# Patient Record
Sex: Female | Born: 1950 | Race: Black or African American | Hispanic: No | Smoking: Never smoker
Health system: Southern US, Community
[De-identification: ages and names within clinical notes are randomized; demographics above are authoritative.]

## PROBLEM LIST (undated history)

## (undated) DIAGNOSIS — I10 Essential (primary) hypertension: Secondary | ICD-10-CM

## (undated) DIAGNOSIS — E119 Type 2 diabetes mellitus without complications: Secondary | ICD-10-CM

## (undated) DIAGNOSIS — D649 Anemia, unspecified: Secondary | ICD-10-CM

## (undated) DIAGNOSIS — G4730 Sleep apnea, unspecified: Secondary | ICD-10-CM

## (undated) DIAGNOSIS — K219 Gastro-esophageal reflux disease without esophagitis: Secondary | ICD-10-CM

## (undated) DIAGNOSIS — M1990 Unspecified osteoarthritis, unspecified site: Secondary | ICD-10-CM

## (undated) DIAGNOSIS — E7800 Pure hypercholesterolemia, unspecified: Secondary | ICD-10-CM

## (undated) HISTORY — PX: CARPAL TUNNEL RELEASE: SHX101

## (undated) HISTORY — PX: ABDOMINAL HYSTERECTOMY: SHX81

## (undated) HISTORY — PX: HERNIA REPAIR: SHX51

## (undated) HISTORY — PX: REDUCTION MAMMAPLASTY: SUR839

## (undated) HISTORY — PX: NASAL SEPTUM SURGERY: SHX37

## (undated) HISTORY — PX: BREAST REDUCTION SURGERY: SHX8

---

## 2011-12-08 DIAGNOSIS — D369 Benign neoplasm, unspecified site: Secondary | ICD-10-CM | POA: Insufficient documentation

## 2016-09-05 ENCOUNTER — Ambulatory Visit (HOSPITAL_COMMUNITY)
Admission: EM | Admit: 2016-09-05 | Discharge: 2016-09-05 | Disposition: A | Discharge status: Home / Self Care | Payer: Medicare (Managed Care) | Attending: Family Medicine | Admitting: Family Medicine

## 2016-09-05 ENCOUNTER — Encounter (HOSPITAL_COMMUNITY): Payer: Self-pay | Admitting: Emergency Medicine

## 2016-09-05 DIAGNOSIS — T783XXA Angioneurotic edema, initial encounter: Secondary | ICD-10-CM | POA: Diagnosis not present

## 2016-09-05 DIAGNOSIS — T7840XA Allergy, unspecified, initial encounter: Secondary | ICD-10-CM | POA: Diagnosis not present

## 2016-09-05 HISTORY — DX: Pure hypercholesterolemia, unspecified: E78.00

## 2016-09-05 HISTORY — DX: Type 2 diabetes mellitus without complications: E11.9

## 2016-09-05 HISTORY — DX: Essential (primary) hypertension: I10

## 2016-09-05 HISTORY — DX: Sleep apnea, unspecified: G47.30

## 2016-09-05 MED ORDER — HYDROCODONE-ACETAMINOPHEN 5-325 MG PO TABS: 1.0000 | ORAL_TABLET | Freq: Four times a day (QID) | ORAL | 0 refills | Status: DC | PRN

## 2016-09-05 MED ORDER — CHLORHEXIDINE GLUCONATE 0.12 % MT SOLN: 15.0000 mL | Freq: Two times a day (BID) | OROMUCOSAL | 0 refills | Status: DC

## 2016-09-05 MED ORDER — METHYLPREDNISOLONE ACETATE 80 MG/ML IJ SUSP
INTRAMUSCULAR | Status: AC
  Filled 2016-09-05: qty 1

## 2016-09-05 MED ORDER — METHYLPREDNISOLONE ACETATE 80 MG/ML IJ SUSP
80.0000 mg | Freq: Once | INTRAMUSCULAR | Status: AC
  Administered 2016-09-05: 80 mg via INTRAMUSCULAR

## 2016-09-05 NOTE — ED Provider Notes (Signed)
[place]    CSN: [id number] Arrival date & time: 09/05/16  1947     History   Chief Complaint Chief Complaint  Patient presents with  . Angioedema    HPI Meredith Cline is a 66 y.o. female.   66 yo woman from [place] who is here caring for grandson x 3 weeks.  About three days ago, she developed tongue soreness, lip soreness, some oral swelling in those areas and some neck fullness.  No difficulty swallowing or rash.  Symptoms are progressively worse.      Past Medical History:  Diagnosis Date  . Diabetes mellitus without complication ([place])   . Hypercholesterolemia   . Hypertension   . Sleep apnea     There are no active problems to display for this patient.   Past Surgical History:  Procedure Laterality Date  . ABDOMINAL HYSTERECTOMY    . BREAST REDUCTION SURGERY    . CARPAL TUNNEL RELEASE    . HERNIA REPAIR      OB History    No data available       Home Medications    Prior to Admission medications   Medication Sig Start Date End Date Taking? Authorizing Provider  aspirin EC 81 MG tablet Take 81 mg by mouth daily.   Yes Historical Provider, MD  celecoxib (CELEBREX) 200 MG capsule Take 200 mg by mouth 2 (two) times daily.   Yes Historical Provider, MD  ibuprofen (ADVIL,MOTRIN) 800 MG tablet Take 800 mg by mouth every 8 (eight) hours as needed.   Yes Historical Provider, MD  insulin glargine (LANTUS) 100 UNIT/ML injection Inject 22 Units into the skin at bedtime.   Yes Historical Provider, MD  Iron-Vit C-Vit B12-Folic Acid (IRON 779 PLUS PO) Take by mouth.   Yes Historical Provider, MD  magnesium 30 MG tablet Take 250 mg by mouth 2 (two) times daily.   Yes Historical Provider, MD  metFORMIN (GLUCOPHAGE) 500 MG tablet Take by mouth 2 (two) times daily with a meal.   Yes Historical Provider, MD  metoprolol (LOPRESSOR) 50 MG tablet Take 50 mg by mouth 2 (two) times daily.   Yes Historical Provider, MD  omega-3 acid ethyl esters (LOVAZA) 1 g capsule  Take by mouth 2 (two) times daily.   Yes Historical Provider, MD  omeprazole (PRILOSEC) 20 MG capsule Take 20 mg by mouth daily.   Yes Historical Provider, MD  potassium chloride (K-DUR) 10 MEQ tablet Take 10 mEq by mouth daily.   Yes Historical Provider, MD  simvastatin (ZOCOR) 40 MG tablet Take 40 mg by mouth daily.   Yes Historical Provider, MD  terazosin (HYTRIN) 5 MG capsule Take 5 mg by mouth at bedtime.   Yes Historical Provider, MD  triamterene-hydrochlorothiazide (MAXZIDE-25) 37.5-25 MG tablet Take 1 tablet by mouth daily.   Yes Historical Provider, MD  chlorhexidine (PERIDEX) 0.12 % solution Use as directed 15 mLs in the mouth or throat 2 (two) times daily. 09/05/16   Robyn Haber, MD  HYDROcodone-acetaminophen (NORCO) 5-325 MG tablet Take 1 tablet by mouth every 6 (six) hours as needed for moderate pain. 09/05/16   Robyn Haber, MD    Family History History reviewed. No pertinent family history.  Social History Social History  Substance Use Topics  . Smoking status: Never Smoker  . Smokeless tobacco: Never Used  . Alcohol use No     Allergies   Clindamycin/lincomycin and Penicillins   Review of Systems Review of Systems  Constitutional: Negative.  HENT: Positive for facial swelling and mouth sores.   Respiratory: Negative.   Cardiovascular: Negative.   Gastrointestinal: Negative.   Genitourinary: Negative.   Musculoskeletal: Negative.   Neurological: Negative.   All other systems reviewed and are negative.    Physical Exam Triage Vital Signs ED Triage Vitals  Enc Vitals Group     BP 09/05/16 2010 (!) 177/77     Pulse Rate 09/05/16 2010 88     Resp 09/05/16 2010 20     Temp 09/05/16 2010 98.8 F (37.1 C)     Temp Source 09/05/16 2010 Oral     SpO2 09/05/16 2010 98 %     Weight --      Height --      Head Circumference --      Peak Flow --      Pain Score 09/05/16 2002 4     Pain Loc --      Pain Edu? --      Excl. in [place]? --    No data  found.   Updated Vital Signs BP (!) 177/77 (BP Location: Right Wrist)   Pulse 88   Temp 98.8 F (37.1 C) (Oral)   Resp 20   SpO2 98%    Physical Exam  Constitutional: She appears well-developed and well-nourished.  HENT:  Right Ear: External ear normal.  Left Ear: External ear normal.  Mild tongue swelling and lower lip swelling.  No airway compromise.  Superficial aphthous sores on undersurface of tongue and inner lower lip.  Eyes: Conjunctivae are normal. Pupils are equal, round, and reactive to light.  Neck: Normal range of motion. Neck supple. No tracheal deviation present.  Cardiovascular: Normal rate, regular rhythm and normal heart sounds.   Pulmonary/Chest: Effort normal and breath sounds normal.  Musculoskeletal: Normal range of motion.  Lymphadenopathy:    She has no cervical adenopathy.  Neurological: She is alert.  Skin: Skin is warm and dry.  Nursing note and vitals reviewed.    UC Treatments / Results  Labs (all labs ordered are listed, but only abnormal results are displayed) Labs Reviewed - No data to display  EKG  EKG Interpretation None       Radiology No results found.  Procedures Procedures (including critical care time)  Medications Ordered in UC Medications  methylPREDNISolone acetate (DEPO-MEDROL) injection 80 mg (not administered)     Initial Impression / Assessment and Plan / UC Course  I have reviewed the triage vital signs and the nursing notes.  Pertinent labs & imaging results that were available during my care of the patient were reviewed by me and considered in my medical decision making (see chart for details).     Final Clinical Impressions(s) / UC Diagnoses   Final diagnoses:  Allergic reaction, initial encounter    New Prescriptions New Prescriptions   CHLORHEXIDINE (PERIDEX) 0.12 % SOLUTION    Use as directed 15 mLs in the mouth or throat 2 (two) times daily.   HYDROCODONE-ACETAMINOPHEN (NORCO) 5-325 MG TABLET     Take 1 tablet by mouth every 6 (six) hours as needed for moderate pain.     Robyn Haber, MD 09/05/16 2103

## 2016-09-05 NOTE — ED Triage Notes (Signed)
The patient presented to the [facility] with a complaint of angioedema x3 days.

## 2018-01-02 DIAGNOSIS — M25551 Pain in right hip: Secondary | ICD-10-CM | POA: Diagnosis not present

## 2018-01-02 DIAGNOSIS — Z79899 Other long term (current) drug therapy: Secondary | ICD-10-CM | POA: Diagnosis not present

## 2018-01-02 DIAGNOSIS — M549 Dorsalgia, unspecified: Secondary | ICD-10-CM | POA: Diagnosis not present

## 2018-01-31 DIAGNOSIS — M549 Dorsalgia, unspecified: Secondary | ICD-10-CM | POA: Diagnosis not present

## 2018-01-31 DIAGNOSIS — Z79899 Other long term (current) drug therapy: Secondary | ICD-10-CM | POA: Diagnosis not present

## 2018-01-31 DIAGNOSIS — R197 Diarrhea, unspecified: Secondary | ICD-10-CM | POA: Diagnosis not present

## 2018-02-07 DIAGNOSIS — E119 Type 2 diabetes mellitus without complications: Secondary | ICD-10-CM | POA: Diagnosis not present

## 2018-02-07 DIAGNOSIS — E7800 Pure hypercholesterolemia, unspecified: Secondary | ICD-10-CM | POA: Diagnosis not present

## 2018-02-07 DIAGNOSIS — I10 Essential (primary) hypertension: Secondary | ICD-10-CM | POA: Diagnosis not present

## 2018-02-07 DIAGNOSIS — Z0000 Encounter for general adult medical examination without abnormal findings: Secondary | ICD-10-CM | POA: Diagnosis not present

## 2018-02-07 DIAGNOSIS — R5383 Other fatigue: Secondary | ICD-10-CM | POA: Diagnosis not present

## 2018-02-07 DIAGNOSIS — E559 Vitamin D deficiency, unspecified: Secondary | ICD-10-CM | POA: Diagnosis not present

## 2018-02-21 DIAGNOSIS — M549 Dorsalgia, unspecified: Secondary | ICD-10-CM | POA: Diagnosis not present

## 2018-02-21 DIAGNOSIS — I10 Essential (primary) hypertension: Secondary | ICD-10-CM | POA: Diagnosis not present

## 2018-02-21 DIAGNOSIS — E7800 Pure hypercholesterolemia, unspecified: Secondary | ICD-10-CM | POA: Diagnosis not present

## 2018-02-21 DIAGNOSIS — E119 Type 2 diabetes mellitus without complications: Secondary | ICD-10-CM | POA: Diagnosis not present

## 2018-04-04 DIAGNOSIS — R197 Diarrhea, unspecified: Secondary | ICD-10-CM | POA: Diagnosis not present

## 2018-04-04 DIAGNOSIS — I10 Essential (primary) hypertension: Secondary | ICD-10-CM | POA: Diagnosis not present

## 2018-04-04 DIAGNOSIS — D649 Anemia, unspecified: Secondary | ICD-10-CM | POA: Diagnosis not present

## 2018-04-04 DIAGNOSIS — E119 Type 2 diabetes mellitus without complications: Secondary | ICD-10-CM | POA: Diagnosis not present

## 2018-04-17 DIAGNOSIS — R159 Full incontinence of feces: Secondary | ICD-10-CM | POA: Diagnosis not present

## 2018-04-17 DIAGNOSIS — R197 Diarrhea, unspecified: Secondary | ICD-10-CM | POA: Diagnosis not present

## 2018-04-24 DIAGNOSIS — R197 Diarrhea, unspecified: Secondary | ICD-10-CM | POA: Diagnosis not present

## 2018-05-16 DIAGNOSIS — Z01818 Encounter for other preprocedural examination: Secondary | ICD-10-CM | POA: Diagnosis not present

## 2018-05-16 DIAGNOSIS — E119 Type 2 diabetes mellitus without complications: Secondary | ICD-10-CM | POA: Diagnosis not present

## 2018-05-16 DIAGNOSIS — R197 Diarrhea, unspecified: Secondary | ICD-10-CM | POA: Diagnosis not present

## 2018-05-17 DIAGNOSIS — K529 Noninfective gastroenteritis and colitis, unspecified: Secondary | ICD-10-CM | POA: Diagnosis not present

## 2018-05-18 DIAGNOSIS — K529 Noninfective gastroenteritis and colitis, unspecified: Secondary | ICD-10-CM | POA: Diagnosis not present

## 2018-06-04 DIAGNOSIS — I10 Essential (primary) hypertension: Secondary | ICD-10-CM | POA: Diagnosis not present

## 2018-06-04 DIAGNOSIS — E119 Type 2 diabetes mellitus without complications: Secondary | ICD-10-CM | POA: Diagnosis not present

## 2018-06-04 DIAGNOSIS — E7800 Pure hypercholesterolemia, unspecified: Secondary | ICD-10-CM | POA: Diagnosis not present

## 2018-06-04 DIAGNOSIS — Z79899 Other long term (current) drug therapy: Secondary | ICD-10-CM | POA: Diagnosis not present

## 2018-06-21 DIAGNOSIS — R197 Diarrhea, unspecified: Secondary | ICD-10-CM | POA: Diagnosis not present

## 2018-06-21 DIAGNOSIS — Z1211 Encounter for screening for malignant neoplasm of colon: Secondary | ICD-10-CM | POA: Diagnosis not present

## 2018-08-02 DIAGNOSIS — M79604 Pain in right leg: Secondary | ICD-10-CM | POA: Diagnosis not present

## 2018-08-02 DIAGNOSIS — H532 Diplopia: Secondary | ICD-10-CM | POA: Diagnosis not present

## 2018-08-02 DIAGNOSIS — R682 Dry mouth, unspecified: Secondary | ICD-10-CM | POA: Diagnosis not present

## 2018-08-02 DIAGNOSIS — E119 Type 2 diabetes mellitus without complications: Secondary | ICD-10-CM | POA: Diagnosis not present

## 2018-08-02 DIAGNOSIS — M79605 Pain in left leg: Secondary | ICD-10-CM | POA: Diagnosis not present

## 2018-08-09 DIAGNOSIS — G629 Polyneuropathy, unspecified: Secondary | ICD-10-CM | POA: Diagnosis not present

## 2018-10-08 DIAGNOSIS — Z760 Encounter for issue of repeat prescription: Secondary | ICD-10-CM | POA: Diagnosis not present

## 2018-10-08 DIAGNOSIS — E7800 Pure hypercholesterolemia, unspecified: Secondary | ICD-10-CM | POA: Diagnosis not present

## 2018-10-08 DIAGNOSIS — I10 Essential (primary) hypertension: Secondary | ICD-10-CM | POA: Diagnosis not present

## 2018-10-08 DIAGNOSIS — E119 Type 2 diabetes mellitus without complications: Secondary | ICD-10-CM | POA: Diagnosis not present

## 2018-10-16 DIAGNOSIS — M129 Arthropathy, unspecified: Secondary | ICD-10-CM | POA: Diagnosis not present

## 2018-10-16 DIAGNOSIS — E119 Type 2 diabetes mellitus without complications: Secondary | ICD-10-CM | POA: Diagnosis not present

## 2018-10-16 DIAGNOSIS — Z1159 Encounter for screening for other viral diseases: Secondary | ICD-10-CM | POA: Diagnosis not present

## 2018-10-16 DIAGNOSIS — Z0000 Encounter for general adult medical examination without abnormal findings: Secondary | ICD-10-CM | POA: Diagnosis not present

## 2018-10-16 DIAGNOSIS — E7800 Pure hypercholesterolemia, unspecified: Secondary | ICD-10-CM | POA: Diagnosis not present

## 2018-10-16 DIAGNOSIS — E559 Vitamin D deficiency, unspecified: Secondary | ICD-10-CM | POA: Diagnosis not present

## 2018-10-16 DIAGNOSIS — D539 Nutritional anemia, unspecified: Secondary | ICD-10-CM | POA: Diagnosis not present

## 2018-10-16 DIAGNOSIS — I10 Essential (primary) hypertension: Secondary | ICD-10-CM | POA: Diagnosis not present

## 2018-10-30 ENCOUNTER — Other Ambulatory Visit: Payer: Self-pay | Admitting: Physician Assistant

## 2018-10-30 DIAGNOSIS — Z780 Asymptomatic menopausal state: Secondary | ICD-10-CM

## 2018-11-09 DIAGNOSIS — D487 Neoplasm of uncertain behavior of other specified sites: Secondary | ICD-10-CM | POA: Diagnosis not present

## 2018-11-14 DIAGNOSIS — L980 Pyogenic granuloma: Secondary | ICD-10-CM | POA: Diagnosis not present

## 2019-01-22 ENCOUNTER — Other Ambulatory Visit: Payer: Self-pay | Admitting: Physician Assistant

## 2019-01-22 DIAGNOSIS — M545 Low back pain: Secondary | ICD-10-CM | POA: Diagnosis not present

## 2019-01-22 DIAGNOSIS — G8929 Other chronic pain: Secondary | ICD-10-CM | POA: Diagnosis not present

## 2019-01-22 DIAGNOSIS — E559 Vitamin D deficiency, unspecified: Secondary | ICD-10-CM | POA: Diagnosis not present

## 2019-01-22 DIAGNOSIS — H524 Presbyopia: Secondary | ICD-10-CM | POA: Diagnosis not present

## 2019-01-22 DIAGNOSIS — I10 Essential (primary) hypertension: Secondary | ICD-10-CM | POA: Diagnosis not present

## 2019-01-22 DIAGNOSIS — D539 Nutritional anemia, unspecified: Secondary | ICD-10-CM | POA: Diagnosis not present

## 2019-01-22 DIAGNOSIS — E7800 Pure hypercholesterolemia, unspecified: Secondary | ICD-10-CM | POA: Diagnosis not present

## 2019-01-22 DIAGNOSIS — Z1159 Encounter for screening for other viral diseases: Secondary | ICD-10-CM | POA: Diagnosis not present

## 2019-01-22 DIAGNOSIS — E119 Type 2 diabetes mellitus without complications: Secondary | ICD-10-CM | POA: Diagnosis not present

## 2019-01-22 DIAGNOSIS — Z961 Presence of intraocular lens: Secondary | ICD-10-CM | POA: Diagnosis not present

## 2019-01-22 DIAGNOSIS — Z1231 Encounter for screening mammogram for malignant neoplasm of breast: Secondary | ICD-10-CM

## 2019-01-22 DIAGNOSIS — H16403 Unspecified corneal neovascularization, bilateral: Secondary | ICD-10-CM | POA: Diagnosis not present

## 2019-01-22 DIAGNOSIS — M8580 Other specified disorders of bone density and structure, unspecified site: Secondary | ICD-10-CM

## 2019-01-23 ENCOUNTER — Other Ambulatory Visit: Payer: Self-pay | Admitting: Physician Assistant

## 2019-01-23 DIAGNOSIS — Z1231 Encounter for screening mammogram for malignant neoplasm of breast: Secondary | ICD-10-CM

## 2019-01-28 DIAGNOSIS — M1990 Unspecified osteoarthritis, unspecified site: Secondary | ICD-10-CM | POA: Diagnosis not present

## 2019-01-28 DIAGNOSIS — R5383 Other fatigue: Secondary | ICD-10-CM | POA: Diagnosis not present

## 2019-01-28 DIAGNOSIS — M19071 Primary osteoarthritis, right ankle and foot: Secondary | ICD-10-CM | POA: Diagnosis not present

## 2019-01-28 DIAGNOSIS — R682 Dry mouth, unspecified: Secondary | ICD-10-CM | POA: Diagnosis not present

## 2019-01-28 DIAGNOSIS — M1811 Unilateral primary osteoarthritis of first carpometacarpal joint, right hand: Secondary | ICD-10-CM | POA: Diagnosis not present

## 2019-01-28 DIAGNOSIS — M1711 Unilateral primary osteoarthritis, right knee: Secondary | ICD-10-CM | POA: Diagnosis not present

## 2019-01-28 DIAGNOSIS — M25562 Pain in left knee: Secondary | ICD-10-CM | POA: Diagnosis not present

## 2019-01-28 DIAGNOSIS — M19072 Primary osteoarthritis, left ankle and foot: Secondary | ICD-10-CM | POA: Diagnosis not present

## 2019-01-28 DIAGNOSIS — M3500 Sicca syndrome, unspecified: Secondary | ICD-10-CM | POA: Diagnosis not present

## 2019-01-28 DIAGNOSIS — H04129 Dry eye syndrome of unspecified lacrimal gland: Secondary | ICD-10-CM | POA: Diagnosis not present

## 2019-01-28 DIAGNOSIS — M1712 Unilateral primary osteoarthritis, left knee: Secondary | ICD-10-CM | POA: Diagnosis not present

## 2019-01-28 DIAGNOSIS — M79642 Pain in left hand: Secondary | ICD-10-CM | POA: Diagnosis not present

## 2019-01-28 DIAGNOSIS — M25473 Effusion, unspecified ankle: Secondary | ICD-10-CM | POA: Diagnosis not present

## 2019-01-28 DIAGNOSIS — M545 Low back pain: Secondary | ICD-10-CM | POA: Diagnosis not present

## 2019-01-28 DIAGNOSIS — M79643 Pain in unspecified hand: Secondary | ICD-10-CM | POA: Diagnosis not present

## 2019-01-28 DIAGNOSIS — M2550 Pain in unspecified joint: Secondary | ICD-10-CM | POA: Diagnosis not present

## 2019-01-28 DIAGNOSIS — M79672 Pain in left foot: Secondary | ICD-10-CM | POA: Diagnosis not present

## 2019-01-28 DIAGNOSIS — E1169 Type 2 diabetes mellitus with other specified complication: Secondary | ICD-10-CM | POA: Diagnosis not present

## 2019-01-28 DIAGNOSIS — M1812 Unilateral primary osteoarthritis of first carpometacarpal joint, left hand: Secondary | ICD-10-CM | POA: Diagnosis not present

## 2019-01-28 DIAGNOSIS — M549 Dorsalgia, unspecified: Secondary | ICD-10-CM | POA: Diagnosis not present

## 2019-01-28 DIAGNOSIS — M25561 Pain in right knee: Secondary | ICD-10-CM | POA: Diagnosis not present

## 2019-01-28 DIAGNOSIS — M791 Myalgia, unspecified site: Secondary | ICD-10-CM | POA: Diagnosis not present

## 2019-01-28 DIAGNOSIS — M79641 Pain in right hand: Secondary | ICD-10-CM | POA: Diagnosis not present

## 2019-01-28 DIAGNOSIS — M79671 Pain in right foot: Secondary | ICD-10-CM | POA: Diagnosis not present

## 2019-02-19 DIAGNOSIS — M791 Myalgia, unspecified site: Secondary | ICD-10-CM | POA: Diagnosis not present

## 2019-02-19 DIAGNOSIS — M1990 Unspecified osteoarthritis, unspecified site: Secondary | ICD-10-CM | POA: Diagnosis not present

## 2019-02-19 DIAGNOSIS — R700 Elevated erythrocyte sedimentation rate: Secondary | ICD-10-CM | POA: Diagnosis not present

## 2019-02-19 DIAGNOSIS — R51 Headache: Secondary | ICD-10-CM | POA: Diagnosis not present

## 2019-02-19 DIAGNOSIS — M2550 Pain in unspecified joint: Secondary | ICD-10-CM | POA: Diagnosis not present

## 2019-02-19 DIAGNOSIS — M549 Dorsalgia, unspecified: Secondary | ICD-10-CM | POA: Diagnosis not present

## 2019-02-19 DIAGNOSIS — E1169 Type 2 diabetes mellitus with other specified complication: Secondary | ICD-10-CM | POA: Diagnosis not present

## 2019-02-19 DIAGNOSIS — R5383 Other fatigue: Secondary | ICD-10-CM | POA: Diagnosis not present

## 2019-02-19 DIAGNOSIS — M79643 Pain in unspecified hand: Secondary | ICD-10-CM | POA: Diagnosis not present

## 2019-02-19 DIAGNOSIS — M3500 Sicca syndrome, unspecified: Secondary | ICD-10-CM | POA: Diagnosis not present

## 2019-02-19 DIAGNOSIS — M0579 Rheumatoid arthritis with rheumatoid factor of multiple sites without organ or systems involvement: Secondary | ICD-10-CM | POA: Diagnosis not present

## 2019-02-25 DIAGNOSIS — H903 Sensorineural hearing loss, bilateral: Secondary | ICD-10-CM | POA: Diagnosis not present

## 2019-02-25 DIAGNOSIS — H9113 Presbycusis, bilateral: Secondary | ICD-10-CM | POA: Insufficient documentation

## 2019-02-25 DIAGNOSIS — H9313 Tinnitus, bilateral: Secondary | ICD-10-CM | POA: Diagnosis not present

## 2019-02-25 DIAGNOSIS — R519 Headache, unspecified: Secondary | ICD-10-CM | POA: Insufficient documentation

## 2019-02-25 DIAGNOSIS — R51 Headache: Secondary | ICD-10-CM | POA: Diagnosis not present

## 2019-02-25 DIAGNOSIS — M3500 Sicca syndrome, unspecified: Secondary | ICD-10-CM | POA: Insufficient documentation

## 2019-03-01 DIAGNOSIS — R519 Headache, unspecified: Secondary | ICD-10-CM | POA: Diagnosis not present

## 2019-03-01 DIAGNOSIS — R700 Elevated erythrocyte sedimentation rate: Secondary | ICD-10-CM | POA: Diagnosis not present

## 2019-03-19 DIAGNOSIS — M791 Myalgia, unspecified site: Secondary | ICD-10-CM | POA: Diagnosis not present

## 2019-03-19 DIAGNOSIS — M549 Dorsalgia, unspecified: Secondary | ICD-10-CM | POA: Diagnosis not present

## 2019-03-19 DIAGNOSIS — E1169 Type 2 diabetes mellitus with other specified complication: Secondary | ICD-10-CM | POA: Diagnosis not present

## 2019-03-19 DIAGNOSIS — M79643 Pain in unspecified hand: Secondary | ICD-10-CM | POA: Diagnosis not present

## 2019-03-19 DIAGNOSIS — M3500 Sicca syndrome, unspecified: Secondary | ICD-10-CM | POA: Diagnosis not present

## 2019-03-19 DIAGNOSIS — R700 Elevated erythrocyte sedimentation rate: Secondary | ICD-10-CM | POA: Diagnosis not present

## 2019-03-19 DIAGNOSIS — R519 Headache, unspecified: Secondary | ICD-10-CM | POA: Diagnosis not present

## 2019-03-19 DIAGNOSIS — R5383 Other fatigue: Secondary | ICD-10-CM | POA: Diagnosis not present

## 2019-03-19 DIAGNOSIS — M1990 Unspecified osteoarthritis, unspecified site: Secondary | ICD-10-CM | POA: Diagnosis not present

## 2019-03-19 DIAGNOSIS — M0579 Rheumatoid arthritis with rheumatoid factor of multiple sites without organ or systems involvement: Secondary | ICD-10-CM | POA: Diagnosis not present

## 2019-03-19 DIAGNOSIS — M2550 Pain in unspecified joint: Secondary | ICD-10-CM | POA: Diagnosis not present

## 2019-04-04 ENCOUNTER — Other Ambulatory Visit: Payer: Self-pay | Admitting: Physician Assistant

## 2019-04-04 DIAGNOSIS — M8580 Other specified disorders of bone density and structure, unspecified site: Secondary | ICD-10-CM

## 2019-04-08 ENCOUNTER — Ambulatory Visit
Admission: RE | Admit: 2019-04-08 | Discharge: 2019-04-08 | Disposition: A | Discharge status: Home / Self Care | Payer: PPO | Source: Ambulatory Visit | Attending: Physician Assistant | Admitting: Physician Assistant

## 2019-04-08 ENCOUNTER — Other Ambulatory Visit: Payer: Self-pay

## 2019-04-08 DIAGNOSIS — M8589 Other specified disorders of bone density and structure, multiple sites: Secondary | ICD-10-CM | POA: Diagnosis not present

## 2019-04-08 DIAGNOSIS — M8580 Other specified disorders of bone density and structure, unspecified site: Secondary | ICD-10-CM

## 2019-04-08 DIAGNOSIS — Z1231 Encounter for screening mammogram for malignant neoplasm of breast: Secondary | ICD-10-CM

## 2019-04-08 DIAGNOSIS — Z780 Asymptomatic menopausal state: Secondary | ICD-10-CM | POA: Diagnosis not present

## 2019-04-17 DIAGNOSIS — M3500 Sicca syndrome, unspecified: Secondary | ICD-10-CM | POA: Diagnosis not present

## 2019-04-17 DIAGNOSIS — E1169 Type 2 diabetes mellitus with other specified complication: Secondary | ICD-10-CM | POA: Diagnosis not present

## 2019-04-17 DIAGNOSIS — R700 Elevated erythrocyte sedimentation rate: Secondary | ICD-10-CM | POA: Diagnosis not present

## 2019-04-17 DIAGNOSIS — R5383 Other fatigue: Secondary | ICD-10-CM | POA: Diagnosis not present

## 2019-04-17 DIAGNOSIS — M1990 Unspecified osteoarthritis, unspecified site: Secondary | ICD-10-CM | POA: Diagnosis not present

## 2019-04-17 DIAGNOSIS — M549 Dorsalgia, unspecified: Secondary | ICD-10-CM | POA: Diagnosis not present

## 2019-04-17 DIAGNOSIS — M0579 Rheumatoid arthritis with rheumatoid factor of multiple sites without organ or systems involvement: Secondary | ICD-10-CM | POA: Diagnosis not present

## 2019-04-17 DIAGNOSIS — M79643 Pain in unspecified hand: Secondary | ICD-10-CM | POA: Diagnosis not present

## 2019-04-17 DIAGNOSIS — M791 Myalgia, unspecified site: Secondary | ICD-10-CM | POA: Diagnosis not present

## 2019-04-17 DIAGNOSIS — M2550 Pain in unspecified joint: Secondary | ICD-10-CM | POA: Diagnosis not present

## 2019-04-24 DIAGNOSIS — E119 Type 2 diabetes mellitus without complications: Secondary | ICD-10-CM | POA: Diagnosis not present

## 2019-04-24 DIAGNOSIS — E559 Vitamin D deficiency, unspecified: Secondary | ICD-10-CM | POA: Diagnosis not present

## 2019-04-24 DIAGNOSIS — E7800 Pure hypercholesterolemia, unspecified: Secondary | ICD-10-CM | POA: Diagnosis not present

## 2019-04-24 DIAGNOSIS — I10 Essential (primary) hypertension: Secondary | ICD-10-CM | POA: Diagnosis not present

## 2019-04-24 DIAGNOSIS — D539 Nutritional anemia, unspecified: Secondary | ICD-10-CM | POA: Diagnosis not present

## 2019-04-24 DIAGNOSIS — Z1159 Encounter for screening for other viral diseases: Secondary | ICD-10-CM | POA: Diagnosis not present

## 2019-05-22 DIAGNOSIS — M62830 Muscle spasm of back: Secondary | ICD-10-CM | POA: Diagnosis not present

## 2019-05-22 DIAGNOSIS — E7800 Pure hypercholesterolemia, unspecified: Secondary | ICD-10-CM | POA: Diagnosis not present

## 2019-05-22 DIAGNOSIS — I10 Essential (primary) hypertension: Secondary | ICD-10-CM | POA: Diagnosis not present

## 2019-05-22 DIAGNOSIS — E119 Type 2 diabetes mellitus without complications: Secondary | ICD-10-CM | POA: Diagnosis not present

## 2019-06-17 DIAGNOSIS — M2550 Pain in unspecified joint: Secondary | ICD-10-CM | POA: Diagnosis not present

## 2019-06-17 DIAGNOSIS — M0579 Rheumatoid arthritis with rheumatoid factor of multiple sites without organ or systems involvement: Secondary | ICD-10-CM | POA: Diagnosis not present

## 2019-06-17 DIAGNOSIS — M797 Fibromyalgia: Secondary | ICD-10-CM | POA: Diagnosis not present

## 2019-06-17 DIAGNOSIS — E1169 Type 2 diabetes mellitus with other specified complication: Secondary | ICD-10-CM | POA: Diagnosis not present

## 2019-06-17 DIAGNOSIS — M79643 Pain in unspecified hand: Secondary | ICD-10-CM | POA: Diagnosis not present

## 2019-06-17 DIAGNOSIS — M3500 Sicca syndrome, unspecified: Secondary | ICD-10-CM | POA: Diagnosis not present

## 2019-06-17 DIAGNOSIS — D509 Iron deficiency anemia, unspecified: Secondary | ICD-10-CM | POA: Diagnosis not present

## 2019-06-17 DIAGNOSIS — M1990 Unspecified osteoarthritis, unspecified site: Secondary | ICD-10-CM | POA: Diagnosis not present

## 2019-06-17 DIAGNOSIS — R700 Elevated erythrocyte sedimentation rate: Secondary | ICD-10-CM | POA: Diagnosis not present

## 2019-06-17 DIAGNOSIS — R5383 Other fatigue: Secondary | ICD-10-CM | POA: Diagnosis not present

## 2019-06-17 DIAGNOSIS — M549 Dorsalgia, unspecified: Secondary | ICD-10-CM | POA: Diagnosis not present

## 2019-06-21 DIAGNOSIS — M3509 Sicca syndrome with other organ involvement: Secondary | ICD-10-CM | POA: Diagnosis not present

## 2019-06-21 DIAGNOSIS — G8929 Other chronic pain: Secondary | ICD-10-CM | POA: Diagnosis not present

## 2019-06-21 DIAGNOSIS — E119 Type 2 diabetes mellitus without complications: Secondary | ICD-10-CM | POA: Diagnosis not present

## 2019-06-21 DIAGNOSIS — M545 Low back pain: Secondary | ICD-10-CM | POA: Diagnosis not present

## 2019-06-21 DIAGNOSIS — Z79899 Other long term (current) drug therapy: Secondary | ICD-10-CM | POA: Diagnosis not present

## 2019-07-22 DIAGNOSIS — Z1159 Encounter for screening for other viral diseases: Secondary | ICD-10-CM | POA: Diagnosis not present

## 2019-07-22 DIAGNOSIS — Z79899 Other long term (current) drug therapy: Secondary | ICD-10-CM | POA: Diagnosis not present

## 2019-07-22 DIAGNOSIS — E119 Type 2 diabetes mellitus without complications: Secondary | ICD-10-CM | POA: Diagnosis not present

## 2019-07-22 DIAGNOSIS — E559 Vitamin D deficiency, unspecified: Secondary | ICD-10-CM | POA: Diagnosis not present

## 2019-07-22 DIAGNOSIS — E7800 Pure hypercholesterolemia, unspecified: Secondary | ICD-10-CM | POA: Diagnosis not present

## 2019-07-22 DIAGNOSIS — D539 Nutritional anemia, unspecified: Secondary | ICD-10-CM | POA: Diagnosis not present

## 2019-07-22 DIAGNOSIS — I10 Essential (primary) hypertension: Secondary | ICD-10-CM | POA: Diagnosis not present

## 2019-08-09 DIAGNOSIS — H9113 Presbycusis, bilateral: Secondary | ICD-10-CM | POA: Diagnosis not present

## 2019-08-09 DIAGNOSIS — J3489 Other specified disorders of nose and nasal sinuses: Secondary | ICD-10-CM | POA: Diagnosis not present

## 2019-08-09 DIAGNOSIS — H9319 Tinnitus, unspecified ear: Secondary | ICD-10-CM | POA: Diagnosis not present

## 2019-08-19 DIAGNOSIS — Z79899 Other long term (current) drug therapy: Secondary | ICD-10-CM | POA: Diagnosis not present

## 2019-08-19 DIAGNOSIS — E119 Type 2 diabetes mellitus without complications: Secondary | ICD-10-CM | POA: Diagnosis not present

## 2019-08-19 DIAGNOSIS — G8929 Other chronic pain: Secondary | ICD-10-CM | POA: Diagnosis not present

## 2019-08-19 DIAGNOSIS — M3509 Sicca syndrome with other organ involvement: Secondary | ICD-10-CM | POA: Diagnosis not present

## 2019-08-19 DIAGNOSIS — M545 Low back pain: Secondary | ICD-10-CM | POA: Diagnosis not present

## 2019-09-04 DIAGNOSIS — M545 Low back pain: Secondary | ICD-10-CM | POA: Diagnosis not present

## 2019-09-04 DIAGNOSIS — M25551 Pain in right hip: Secondary | ICD-10-CM | POA: Diagnosis not present

## 2019-09-13 DIAGNOSIS — M545 Low back pain: Secondary | ICD-10-CM | POA: Diagnosis not present

## 2019-10-16 DIAGNOSIS — M2550 Pain in unspecified joint: Secondary | ICD-10-CM | POA: Diagnosis not present

## 2019-10-16 DIAGNOSIS — M797 Fibromyalgia: Secondary | ICD-10-CM | POA: Diagnosis not present

## 2019-10-16 DIAGNOSIS — E1169 Type 2 diabetes mellitus with other specified complication: Secondary | ICD-10-CM | POA: Diagnosis not present

## 2019-10-16 DIAGNOSIS — R700 Elevated erythrocyte sedimentation rate: Secondary | ICD-10-CM | POA: Diagnosis not present

## 2019-10-16 DIAGNOSIS — M1990 Unspecified osteoarthritis, unspecified site: Secondary | ICD-10-CM | POA: Diagnosis not present

## 2019-10-16 DIAGNOSIS — M0579 Rheumatoid arthritis with rheumatoid factor of multiple sites without organ or systems involvement: Secondary | ICD-10-CM | POA: Diagnosis not present

## 2019-10-16 DIAGNOSIS — D649 Anemia, unspecified: Secondary | ICD-10-CM | POA: Diagnosis not present

## 2019-10-16 DIAGNOSIS — M549 Dorsalgia, unspecified: Secondary | ICD-10-CM | POA: Diagnosis not present

## 2019-10-16 DIAGNOSIS — R5383 Other fatigue: Secondary | ICD-10-CM | POA: Diagnosis not present

## 2019-10-16 DIAGNOSIS — M79643 Pain in unspecified hand: Secondary | ICD-10-CM | POA: Diagnosis not present

## 2019-10-16 DIAGNOSIS — M3500 Sicca syndrome, unspecified: Secondary | ICD-10-CM | POA: Diagnosis not present

## 2019-10-17 DIAGNOSIS — Z1159 Encounter for screening for other viral diseases: Secondary | ICD-10-CM | POA: Diagnosis not present

## 2019-10-17 DIAGNOSIS — E559 Vitamin D deficiency, unspecified: Secondary | ICD-10-CM | POA: Diagnosis not present

## 2019-10-17 DIAGNOSIS — M545 Low back pain: Secondary | ICD-10-CM | POA: Diagnosis not present

## 2019-10-17 DIAGNOSIS — Z1389 Encounter for screening for other disorder: Secondary | ICD-10-CM | POA: Diagnosis not present

## 2019-10-17 DIAGNOSIS — Z9181 History of falling: Secondary | ICD-10-CM | POA: Diagnosis not present

## 2019-10-17 DIAGNOSIS — E119 Type 2 diabetes mellitus without complications: Secondary | ICD-10-CM | POA: Diagnosis not present

## 2019-10-17 DIAGNOSIS — D539 Nutritional anemia, unspecified: Secondary | ICD-10-CM | POA: Diagnosis not present

## 2019-10-17 DIAGNOSIS — E7800 Pure hypercholesterolemia, unspecified: Secondary | ICD-10-CM | POA: Diagnosis not present

## 2019-10-17 DIAGNOSIS — Z0000 Encounter for general adult medical examination without abnormal findings: Secondary | ICD-10-CM | POA: Diagnosis not present

## 2019-10-17 DIAGNOSIS — Z79899 Other long term (current) drug therapy: Secondary | ICD-10-CM | POA: Diagnosis not present

## 2019-10-17 DIAGNOSIS — I10 Essential (primary) hypertension: Secondary | ICD-10-CM | POA: Diagnosis not present

## 2019-10-17 DIAGNOSIS — Z113 Encounter for screening for infections with a predominantly sexual mode of transmission: Secondary | ICD-10-CM | POA: Diagnosis not present

## 2019-10-31 DIAGNOSIS — R202 Paresthesia of skin: Secondary | ICD-10-CM | POA: Diagnosis not present

## 2020-01-09 DIAGNOSIS — I10 Essential (primary) hypertension: Secondary | ICD-10-CM | POA: Diagnosis not present

## 2020-01-09 DIAGNOSIS — E7800 Pure hypercholesterolemia, unspecified: Secondary | ICD-10-CM | POA: Diagnosis not present

## 2020-01-09 DIAGNOSIS — D509 Iron deficiency anemia, unspecified: Secondary | ICD-10-CM | POA: Diagnosis not present

## 2020-01-09 DIAGNOSIS — E119 Type 2 diabetes mellitus without complications: Secondary | ICD-10-CM | POA: Diagnosis not present

## 2020-01-09 DIAGNOSIS — E559 Vitamin D deficiency, unspecified: Secondary | ICD-10-CM | POA: Diagnosis not present

## 2020-01-09 DIAGNOSIS — Z79899 Other long term (current) drug therapy: Secondary | ICD-10-CM | POA: Diagnosis not present

## 2020-01-16 DIAGNOSIS — E1169 Type 2 diabetes mellitus with other specified complication: Secondary | ICD-10-CM | POA: Diagnosis not present

## 2020-01-16 DIAGNOSIS — R5383 Other fatigue: Secondary | ICD-10-CM | POA: Diagnosis not present

## 2020-01-16 DIAGNOSIS — Z79899 Other long term (current) drug therapy: Secondary | ICD-10-CM | POA: Diagnosis not present

## 2020-01-16 DIAGNOSIS — M549 Dorsalgia, unspecified: Secondary | ICD-10-CM | POA: Diagnosis not present

## 2020-01-16 DIAGNOSIS — M3500 Sicca syndrome, unspecified: Secondary | ICD-10-CM | POA: Diagnosis not present

## 2020-01-16 DIAGNOSIS — M79643 Pain in unspecified hand: Secondary | ICD-10-CM | POA: Diagnosis not present

## 2020-01-16 DIAGNOSIS — M797 Fibromyalgia: Secondary | ICD-10-CM | POA: Diagnosis not present

## 2020-01-16 DIAGNOSIS — M2550 Pain in unspecified joint: Secondary | ICD-10-CM | POA: Diagnosis not present

## 2020-01-16 DIAGNOSIS — D649 Anemia, unspecified: Secondary | ICD-10-CM | POA: Diagnosis not present

## 2020-01-16 DIAGNOSIS — R700 Elevated erythrocyte sedimentation rate: Secondary | ICD-10-CM | POA: Diagnosis not present

## 2020-01-16 DIAGNOSIS — M1990 Unspecified osteoarthritis, unspecified site: Secondary | ICD-10-CM | POA: Diagnosis not present

## 2020-01-16 DIAGNOSIS — M0579 Rheumatoid arthritis with rheumatoid factor of multiple sites without organ or systems involvement: Secondary | ICD-10-CM | POA: Diagnosis not present

## 2020-01-17 DIAGNOSIS — E119 Type 2 diabetes mellitus without complications: Secondary | ICD-10-CM | POA: Diagnosis not present

## 2020-01-17 DIAGNOSIS — H524 Presbyopia: Secondary | ICD-10-CM | POA: Diagnosis not present

## 2020-02-13 DIAGNOSIS — R079 Chest pain, unspecified: Secondary | ICD-10-CM | POA: Diagnosis not present

## 2020-02-13 DIAGNOSIS — R0602 Shortness of breath: Secondary | ICD-10-CM | POA: Diagnosis not present

## 2020-02-13 DIAGNOSIS — R42 Dizziness and giddiness: Secondary | ICD-10-CM | POA: Diagnosis not present

## 2020-02-18 DIAGNOSIS — H04122 Dry eye syndrome of left lacrimal gland: Secondary | ICD-10-CM | POA: Diagnosis not present

## 2020-02-18 DIAGNOSIS — H16142 Punctate keratitis, left eye: Secondary | ICD-10-CM | POA: Diagnosis not present

## 2020-02-21 ENCOUNTER — Other Ambulatory Visit: Payer: Self-pay

## 2020-02-21 ENCOUNTER — Emergency Department (HOSPITAL_COMMUNITY): Payer: PPO

## 2020-02-21 ENCOUNTER — Emergency Department (HOSPITAL_COMMUNITY)
Admission: EM | Admit: 2020-02-21 | Discharge: 2020-02-22 | Disposition: A | Discharge status: Home / Self Care | Payer: PPO | Attending: Emergency Medicine | Admitting: Emergency Medicine

## 2020-02-21 ENCOUNTER — Encounter (HOSPITAL_COMMUNITY): Payer: Self-pay | Admitting: Emergency Medicine

## 2020-02-21 DIAGNOSIS — Z20822 Contact with and (suspected) exposure to covid-19: Secondary | ICD-10-CM | POA: Diagnosis not present

## 2020-02-21 DIAGNOSIS — R079 Chest pain, unspecified: Secondary | ICD-10-CM

## 2020-02-21 DIAGNOSIS — I10 Essential (primary) hypertension: Secondary | ICD-10-CM | POA: Insufficient documentation

## 2020-02-21 DIAGNOSIS — E119 Type 2 diabetes mellitus without complications: Secondary | ICD-10-CM | POA: Insufficient documentation

## 2020-02-21 DIAGNOSIS — R0602 Shortness of breath: Secondary | ICD-10-CM | POA: Diagnosis present

## 2020-02-21 DIAGNOSIS — R0789 Other chest pain: Secondary | ICD-10-CM | POA: Diagnosis not present

## 2020-02-21 LAB — BASIC METABOLIC PANEL
Anion gap: 10 (ref 5–15)
BUN: 17 mg/dL (ref 8–23)
CO2: 24 mmol/L (ref 22–32)
Calcium: 9.4 mg/dL (ref 8.9–10.3)
Chloride: 104 mmol/L (ref 98–111)
Creatinine, Ser: 1.08 mg/dL — ABNORMAL HIGH (ref 0.44–1.00)
GFR calc Af Amer: >60 mL/min (ref >60)
GFR calc non Af Amer: 52 mL/min — ABNORMAL LOW (ref >60)
Glucose, Bld: 103 mg/dL — ABNORMAL HIGH (ref 70–99)
Potassium: 3.7 mmol/L (ref 3.5–5.1)
Sodium: 138 mmol/L (ref 135–145)

## 2020-02-21 LAB — RESPIRATORY PANEL BY RT PCR (FLU A&B, COVID)
Influenza A by PCR: NEGATIVE
Influenza B by PCR: NEGATIVE
SARS Coronavirus 2 by RT PCR: NEGATIVE

## 2020-02-21 LAB — CBC
HCT: 33.6 % — ABNORMAL LOW (ref 36.0–46.0)
Hemoglobin: 10.5 g/dL — ABNORMAL LOW (ref 12.0–15.0)
MCH: 26.3 pg (ref 26.0–34.0)
MCHC: 31.3 g/dL (ref 30.0–36.0)
MCV: 84.2 fL (ref 80.0–100.0)
Platelets: 263 10*3/uL (ref 150–400)
RBC: 3.99 MIL/uL (ref 3.87–5.11)
RDW: 15.6 % — ABNORMAL HIGH (ref 11.5–15.5)
WBC: 5.1 10*3/uL (ref 4.0–10.5)
nRBC: 0 % (ref 0.0–0.2)

## 2020-02-21 LAB — BRAIN NATRIURETIC PEPTIDE: B Natriuretic Peptide: 54.2 pg/mL (ref 0.0–100.0)

## 2020-02-21 LAB — TROPONIN I (HIGH SENSITIVITY): Troponin I (High Sensitivity): 24 ng/L — ABNORMAL HIGH (ref <18)

## 2020-02-21 MED ORDER — SODIUM CHLORIDE (PF) 0.9 % IJ SOLN
INTRAMUSCULAR | Status: AC
  Filled 2020-02-21: qty 50

## 2020-02-21 NOTE — ED Provider Notes (Signed)
[facility] Emergency Department Provider Note MRN:  [id number]  Arrival date & time: 02/22/20     Chief Complaint   Shortness of Breath and Chest Pain   History of Present Illness   Meredith Cline is a 69 y.o. year-old female with a history of diabetes, hypertension presenting to the ED with chief complaint of shortness of breath and chest pain.  Patient has been feeling some mild chest pain and lightheadedness for the past week, worse today with fast heartbeat, accompanied with shortness of breath, discomfort worse with deep breathing.  Denies leg pain or swelling.  Pain described as dull, pressure-like, mild to moderate, constant.  Denies abdominal pain, no headache, no vision change.  Review of Systems  A complete 10 system review of systems was obtained and all systems are negative except as noted in the HPI and PMH.   Patient's Health History    Past Medical History:  Diagnosis Date  . Diabetes mellitus without complication ([place])   . Hypercholesterolemia   . Hypertension   . Sleep apnea     Past Surgical History:  Procedure Laterality Date  . ABDOMINAL HYSTERECTOMY    . BREAST REDUCTION SURGERY    . CARPAL TUNNEL RELEASE    . HERNIA REPAIR    . REDUCTION MAMMAPLASTY     app. 1980    History reviewed. No pertinent family history.  Social History   Socioeconomic History  . Marital status: Single    Spouse name: Not on file  . Number of children: Not on file  . Years of education: Not on file  . Highest education level: Not on file  Occupational History  . Not on file  Tobacco Use  . Smoking status: Never Smoker  . Smokeless tobacco: Never Used  Vaping Use  . Vaping Use: Never used  Substance and Sexual Activity  . Alcohol use: No  . Drug use: Never  . Sexual activity: Not on file  Other Topics Concern  . Not on file  Social History Narrative  . Not on file   Social Determinants of Health   Financial Resource Strain:   .  Difficulty of Paying Living Expenses: Not on file  Food Insecurity:   . Worried About Charity fundraiser in the Last Year: Not on file  . Ran Out of Food in the Last Year: Not on file  Transportation Needs:   . Lack of Transportation (Medical): Not on file  . Lack of Transportation (Non-Medical): Not on file  Physical Activity:   . Days of Exercise per Week: Not on file  . Minutes of Exercise per Session: Not on file  Stress:   . Feeling of Stress : Not on file  Social Connections:   . Frequency of Communication with Friends and Family: Not on file  . Frequency of Social Gatherings with Friends and Family: Not on file  . Attends Religious Services: Not on file  . Active Member of Clubs or Organizations: Not on file  . Attends Archivist Meetings: Not on file  . Marital Status: Not on file  Intimate Partner Violence:   . Fear of Current or Ex-Partner: Not on file  . Emotionally Abused: Not on file  . Physically Abused: Not on file  . Sexually Abused: Not on file     Physical Exam   Vitals:   02/22/20 0100 02/22/20 0130  BP: (!) 160/73 (!) 163/84  Pulse: 80 84  Resp: 16 19  Temp:    SpO2: 95% 92%    CONSTITUTIONAL: Well-appearing, NAD NEURO:  Alert and oriented x 3, no focal deficits EYES:  eyes equal and reactive ENT/NECK:  no LAD, no JVD CARDIO: Tachycardic rate, well-perfused, normal S1 and S2 PULM:  CTAB no wheezing or rhonchi GI/GU:  normal bowel sounds, non-distended, non-tender MSK/SPINE:  No gross deformities, no edema SKIN:  no rash, atraumatic PSYCH:  Appropriate speech and behavior  *Additional and/or pertinent findings included in MDM below  Diagnostic and Interventional Summary    EKG Interpretation  Date/Time:  Friday February 21 2020 21:31:17 EDT Ventricular Rate:  133 PR Interval:    QRS Duration: 92 QT Interval:  305 QTC Calculation: 454 R Axis:   -5 Text Interpretation: Sinus tachycardia No previous tracing Confirmed by Lajean Saver [phone]) on 02/21/2020 10:52:37 PM      Labs Reviewed  BASIC METABOLIC PANEL - Abnormal; Notable for the following components:      Result Value   Glucose, Bld 103 (*)    Creatinine, Ser 1.08 (*)    GFR calc non Af Amer 52 (*)    All other components within normal limits  CBC - Abnormal; Notable for the following components:   Hemoglobin 10.5 (*)    HCT 33.6 (*)    RDW 15.6 (*)    All other components within normal limits  TROPONIN I (HIGH SENSITIVITY) - Abnormal; Notable for the following components:   Troponin I (High Sensitivity) 24 (*)    All other components within normal limits  TROPONIN I (HIGH SENSITIVITY) - Abnormal; Notable for the following components:   Troponin I (High Sensitivity) 24 (*)    All other components within normal limits  RESPIRATORY PANEL BY RT PCR (FLU A&B, COVID)  BRAIN NATRIURETIC PEPTIDE    CT ANGIO CHEST PE W OR WO CONTRAST  Final Result    DG Chest 2 View  Final Result      Medications  sodium chloride (PF) 0.9 % injection (  Given by Other 02/22/20 0031)  iohexol (OMNIPAQUE) 350 MG/ML injection 100 mL (100 mLs Intravenous Contrast Given 02/22/20 0016)     Procedures  /  Critical Care Procedures  ED Course and Medical Decision Making  I have reviewed the triage vital signs, the nursing notes, and pertinent available records from the EMR.  Listed above are laboratory and imaging tests that I personally ordered, reviewed, and interpreted and then considered in my medical decision making (see below for details).  Moderate to high suspicion for PE, will need CTA.  Also considering ACS, EKG is without ischemic features.     Work-up reveals a minimally elevated troponin at 24, repeat 2 hours later flat at 24. CTA is without evidence of PE. Upon reassessment patient is overall feeling better, no longer tachycardic. We discussed management options, with option 1 being admission for trending of troponins and possible inpatient stress testing  option to being close follow-up and strict return precautions. Patient prefers discharge and close outpatient follow-up, she already has an echocardiogram scheduled early next week.  [name]. [name], [place] [email or web address]  Final Clinical Impressions(s) / ED Diagnoses     ICD-10-CM   1. Chest pain, unspecified type  R07.9     ED Discharge Orders    None       Discharge Instructions Discussed with and Provided to Patient:     Discharge Instructions     You were evaluated  in the Emergency Department and after careful evaluation, we did not find any emergent condition requiring admission or further testing in the hospital.  Your exam/testing today was overall reassuring. As discussed, we recommend close follow-up with your primary care doctor or cardiologist to discuss your symptoms and possibly consider outpatient stress testing. We advise this follow-up within the next 1 to 2 weeks.  Please return to the Emergency Department if you experience any worsening of your condition.  Thank you for allowing [place] to be a part of your care.        Maudie Flakes, MD 02/22/20 [phone]

## 2020-02-21 NOTE — ED Notes (Signed)
Patient transported to X-ray 

## 2020-02-21 NOTE — ED Triage Notes (Signed)
Patient states she has been having pain since the middle of the week. Patient went to pcp and the ekg was fine. PCP schedule echo for next Tuesday. Patient states she is having left chest pain that is getting worse. Patient is also complaining of sob.

## 2020-02-22 ENCOUNTER — Emergency Department (HOSPITAL_COMMUNITY): Payer: PPO

## 2020-02-22 ENCOUNTER — Encounter (HOSPITAL_COMMUNITY): Payer: Self-pay

## 2020-02-22 DIAGNOSIS — R0789 Other chest pain: Secondary | ICD-10-CM | POA: Diagnosis not present

## 2020-02-22 LAB — TROPONIN I (HIGH SENSITIVITY): Troponin I (High Sensitivity): 24 ng/L — ABNORMAL HIGH (ref <18)

## 2020-02-22 MED ORDER — IOHEXOL 350 MG/ML SOLN
100.0000 mL | Freq: Once | INTRAVENOUS | Status: AC | PRN
  Administered 2020-02-22: 100 mL via INTRAVENOUS

## 2020-02-22 NOTE — ED Notes (Signed)
Patient transported to CT 

## 2020-02-22 NOTE — Discharge Instructions (Addendum)
You were evaluated in the Emergency Department and after careful evaluation, we did not find any emergent condition requiring admission or further testing in the hospital.  Your exam/testing today was overall reassuring. As discussed, we recommend close follow-up with your primary care doctor or cardiologist to discuss your symptoms and possibly consider outpatient stress testing. We advise this follow-up within the next 1 to 2 weeks.  Please return to the Emergency Department if you experience any worsening of your condition.  Thank you for allowing [place] to be a part of your care.

## 2020-02-25 DIAGNOSIS — R0602 Shortness of breath: Secondary | ICD-10-CM | POA: Diagnosis not present

## 2020-03-11 DIAGNOSIS — I10 Essential (primary) hypertension: Secondary | ICD-10-CM | POA: Diagnosis not present

## 2020-03-11 DIAGNOSIS — G63 Polyneuropathy in diseases classified elsewhere: Secondary | ICD-10-CM | POA: Diagnosis not present

## 2020-03-11 DIAGNOSIS — M3509 Sicca syndrome with other organ involvement: Secondary | ICD-10-CM | POA: Diagnosis not present

## 2020-03-11 DIAGNOSIS — E119 Type 2 diabetes mellitus without complications: Secondary | ICD-10-CM | POA: Diagnosis not present

## 2020-03-19 DIAGNOSIS — H04222 Epiphora due to insufficient drainage, left lacrimal gland: Secondary | ICD-10-CM | POA: Diagnosis not present

## 2020-03-19 DIAGNOSIS — H16142 Punctate keratitis, left eye: Secondary | ICD-10-CM | POA: Diagnosis not present

## 2020-03-19 DIAGNOSIS — H04122 Dry eye syndrome of left lacrimal gland: Secondary | ICD-10-CM | POA: Diagnosis not present

## 2020-03-23 ENCOUNTER — Other Ambulatory Visit: Payer: Self-pay | Admitting: Family Medicine

## 2020-03-23 DIAGNOSIS — Z1231 Encounter for screening mammogram for malignant neoplasm of breast: Secondary | ICD-10-CM

## 2020-03-26 DIAGNOSIS — I10 Essential (primary) hypertension: Secondary | ICD-10-CM | POA: Diagnosis not present

## 2020-04-20 DIAGNOSIS — M0579 Rheumatoid arthritis with rheumatoid factor of multiple sites without organ or systems involvement: Secondary | ICD-10-CM | POA: Diagnosis not present

## 2020-04-20 DIAGNOSIS — R700 Elevated erythrocyte sedimentation rate: Secondary | ICD-10-CM | POA: Diagnosis not present

## 2020-04-20 DIAGNOSIS — E1169 Type 2 diabetes mellitus with other specified complication: Secondary | ICD-10-CM | POA: Diagnosis not present

## 2020-04-20 DIAGNOSIS — R5383 Other fatigue: Secondary | ICD-10-CM | POA: Diagnosis not present

## 2020-04-20 DIAGNOSIS — M1990 Unspecified osteoarthritis, unspecified site: Secondary | ICD-10-CM | POA: Diagnosis not present

## 2020-04-20 DIAGNOSIS — M3500 Sicca syndrome, unspecified: Secondary | ICD-10-CM | POA: Diagnosis not present

## 2020-04-20 DIAGNOSIS — M542 Cervicalgia: Secondary | ICD-10-CM | POA: Diagnosis not present

## 2020-04-20 DIAGNOSIS — M549 Dorsalgia, unspecified: Secondary | ICD-10-CM | POA: Diagnosis not present

## 2020-04-20 DIAGNOSIS — M2550 Pain in unspecified joint: Secondary | ICD-10-CM | POA: Diagnosis not present

## 2020-04-20 DIAGNOSIS — M79643 Pain in unspecified hand: Secondary | ICD-10-CM | POA: Diagnosis not present

## 2020-04-20 DIAGNOSIS — M797 Fibromyalgia: Secondary | ICD-10-CM | POA: Diagnosis not present

## 2020-04-27 DIAGNOSIS — I10 Essential (primary) hypertension: Secondary | ICD-10-CM | POA: Diagnosis not present

## 2020-05-05 ENCOUNTER — Ambulatory Visit
Admission: RE | Admit: 2020-05-05 | Discharge: 2020-05-05 | Disposition: A | Discharge status: Home / Self Care | Payer: PPO | Source: Ambulatory Visit | Attending: Family Medicine | Admitting: Family Medicine

## 2020-05-05 ENCOUNTER — Other Ambulatory Visit: Payer: Self-pay

## 2020-05-05 DIAGNOSIS — Z1231 Encounter for screening mammogram for malignant neoplasm of breast: Secondary | ICD-10-CM

## 2020-05-11 DIAGNOSIS — M545 Low back pain, unspecified: Secondary | ICD-10-CM | POA: Diagnosis not present

## 2020-05-11 DIAGNOSIS — M3506 Sjogren syndrome with peripheral nervous system involvement: Secondary | ICD-10-CM | POA: Diagnosis not present

## 2020-05-11 DIAGNOSIS — G8929 Other chronic pain: Secondary | ICD-10-CM | POA: Diagnosis not present

## 2020-05-11 DIAGNOSIS — Z79899 Other long term (current) drug therapy: Secondary | ICD-10-CM | POA: Diagnosis not present

## 2020-05-11 DIAGNOSIS — E119 Type 2 diabetes mellitus without complications: Secondary | ICD-10-CM | POA: Diagnosis not present

## 2020-06-05 ENCOUNTER — Ambulatory Visit
Admission: RE | Admit: 2020-06-05 | Discharge: 2020-06-05 | Disposition: A | Discharge status: Home / Self Care | Payer: PPO | Source: Ambulatory Visit | Attending: Family Medicine | Admitting: Family Medicine

## 2020-06-05 ENCOUNTER — Other Ambulatory Visit: Payer: Self-pay

## 2020-06-05 ENCOUNTER — Other Ambulatory Visit: Payer: Self-pay | Admitting: Family Medicine

## 2020-06-05 DIAGNOSIS — Z1231 Encounter for screening mammogram for malignant neoplasm of breast: Secondary | ICD-10-CM

## 2020-06-11 DIAGNOSIS — M545 Low back pain, unspecified: Secondary | ICD-10-CM | POA: Diagnosis not present

## 2020-06-11 DIAGNOSIS — G8929 Other chronic pain: Secondary | ICD-10-CM | POA: Diagnosis not present

## 2020-06-11 DIAGNOSIS — Z79899 Other long term (current) drug therapy: Secondary | ICD-10-CM | POA: Diagnosis not present

## 2020-06-11 DIAGNOSIS — E119 Type 2 diabetes mellitus without complications: Secondary | ICD-10-CM | POA: Diagnosis not present

## 2020-06-11 DIAGNOSIS — M3506 Sjogren syndrome with peripheral nervous system involvement: Secondary | ICD-10-CM | POA: Diagnosis not present

## 2020-07-09 DIAGNOSIS — M3506 Sjogren syndrome with peripheral nervous system involvement: Secondary | ICD-10-CM | POA: Diagnosis not present

## 2020-07-09 DIAGNOSIS — E782 Mixed hyperlipidemia: Secondary | ICD-10-CM | POA: Diagnosis not present

## 2020-07-09 DIAGNOSIS — E119 Type 2 diabetes mellitus without complications: Secondary | ICD-10-CM | POA: Diagnosis not present

## 2020-07-09 DIAGNOSIS — M545 Low back pain, unspecified: Secondary | ICD-10-CM | POA: Diagnosis not present

## 2020-07-09 DIAGNOSIS — G8929 Other chronic pain: Secondary | ICD-10-CM | POA: Diagnosis not present

## 2020-07-09 DIAGNOSIS — Z79899 Other long term (current) drug therapy: Secondary | ICD-10-CM | POA: Diagnosis not present

## 2020-07-13 ENCOUNTER — Telehealth: Payer: Self-pay | Admitting: *Deleted

## 2020-07-13 NOTE — Telephone Encounter (Signed)
Referral sent for sleep study, please place order. Thank you!

## 2020-07-21 DIAGNOSIS — M2550 Pain in unspecified joint: Secondary | ICD-10-CM | POA: Diagnosis not present

## 2020-07-21 DIAGNOSIS — E1169 Type 2 diabetes mellitus with other specified complication: Secondary | ICD-10-CM | POA: Diagnosis not present

## 2020-07-21 DIAGNOSIS — M79643 Pain in unspecified hand: Secondary | ICD-10-CM | POA: Diagnosis not present

## 2020-07-21 DIAGNOSIS — M3500 Sicca syndrome, unspecified: Secondary | ICD-10-CM | POA: Diagnosis not present

## 2020-07-21 DIAGNOSIS — M0579 Rheumatoid arthritis with rheumatoid factor of multiple sites without organ or systems involvement: Secondary | ICD-10-CM | POA: Diagnosis not present

## 2020-07-21 DIAGNOSIS — N390 Urinary tract infection, site not specified: Secondary | ICD-10-CM | POA: Diagnosis not present

## 2020-07-21 DIAGNOSIS — M549 Dorsalgia, unspecified: Secondary | ICD-10-CM | POA: Diagnosis not present

## 2020-07-21 DIAGNOSIS — M1990 Unspecified osteoarthritis, unspecified site: Secondary | ICD-10-CM | POA: Diagnosis not present

## 2020-07-21 DIAGNOSIS — M542 Cervicalgia: Secondary | ICD-10-CM | POA: Diagnosis not present

## 2020-07-21 DIAGNOSIS — D649 Anemia, unspecified: Secondary | ICD-10-CM | POA: Diagnosis not present

## 2020-07-21 DIAGNOSIS — M797 Fibromyalgia: Secondary | ICD-10-CM | POA: Diagnosis not present

## 2020-07-21 DIAGNOSIS — R5383 Other fatigue: Secondary | ICD-10-CM | POA: Diagnosis not present

## 2020-07-30 NOTE — Telephone Encounter (Signed)
Message sent to [facility] that patient needs to be referred to our office by her provider.

## 2020-08-04 ENCOUNTER — Telehealth: Payer: Self-pay | Admitting: Physician Assistant

## 2020-08-04 NOTE — Telephone Encounter (Signed)
Received a new hem referral from dr. Kathlene November for anemia. Ms. Meredith Cline has been cld and scheduled to see Cassie on 3/15 at 1:30pm w/lab sat 1pm. Pt aware to arrive 20 minutes.

## 2020-08-06 DIAGNOSIS — R209 Unspecified disturbances of skin sensation: Secondary | ICD-10-CM | POA: Diagnosis not present

## 2020-08-06 DIAGNOSIS — Z79899 Other long term (current) drug therapy: Secondary | ICD-10-CM | POA: Diagnosis not present

## 2020-08-06 DIAGNOSIS — G8929 Other chronic pain: Secondary | ICD-10-CM | POA: Diagnosis not present

## 2020-08-06 DIAGNOSIS — Z6834 Body mass index (BMI) 34.0-34.9, adult: Secondary | ICD-10-CM | POA: Diagnosis not present

## 2020-08-06 DIAGNOSIS — M545 Low back pain, unspecified: Secondary | ICD-10-CM | POA: Diagnosis not present

## 2020-08-06 DIAGNOSIS — E119 Type 2 diabetes mellitus without complications: Secondary | ICD-10-CM | POA: Diagnosis not present

## 2020-08-07 ENCOUNTER — Other Ambulatory Visit: Payer: Self-pay | Admitting: Physician Assistant

## 2020-08-07 DIAGNOSIS — D649 Anemia, unspecified: Secondary | ICD-10-CM

## 2020-08-07 NOTE — Progress Notes (Signed)
[place] [phone]   [phone]  CONSULT NOTE  REFERRING PHYSICIAN: Dr. Kathlene November  REASON FOR CONSULTATION:  Anemia  HPI Meredith Cline is a 70 y.o. female with a past medical history significant for osteoarthritis, fibromyalgia, Sjogren's syndrome, diabetes mellitus, rheumatoid arthritis, hypertension, GERD, HLD, vitamin D deficiency, and sleep apnea is referred to the clinic for evaluation of anemia.   The patient recently had a follow up visit with her endocrinologist on 07/27/20 who she sees for Sjorgen's, rheumatoid arthritis, and fibromyalgia. Of note, she is on Arava for an antirheumatic medication. She had lab work performed which showed anemia with a Hbg of 9.5. Her platelets were within normal limits at 261k, and her WBCs were within normal limits at 5.4. Her creatinine is slightly elevated at 1.14. She had a SPEP performed which did not show a M-Spike. She had a UA performed which was negative for blood. She was referred her for evaluation and management of her anemia.   Per chart review, the oldest CBC records available to me are from September 2021 in which her Hgb at that time was 10.5.    The patient is unsure how long she has been anemic for but estimates 3-5 years She has never required an iron infusion before or a blood transfusion. She takes aspirin 81 mg daily but denies other blood thinner use. She takes a prescription iron supplement daily and has been doing so for 2-3 years. She is compliant with this. She also takes a multivitamin daily. She denies significant fatigue. She reports occasional lightheadedness/dizziness.  She denies significant shortness of breath,chest pain or palpitations. She doesnotcrave ice. She denies any other abnormal bleeding including hemoptysis, hematemesis, melena, epistaxis, or hematuria. She reports some extremity bruising due to taking a baby aspirin. No major bruising/echymosis at this time. She denies  any NSAID use.She denies any particular dietary habits such as being a vegan or vegetarian. She estimates that she eats beef 3-4 x per month. The patient denies any history of any bariatric surgeries. She denies any fevers or lymphadenopathy. She had occasional mild night sweats which have occurred periodically over the last year. She estimates she lost about 40 lbs unintentionally over the last 3 years.   Her last colonoscopy was reportedly ~1 year ago. There are no records available to me. She cannot remember the name of the practice or her gastroenterologist. She states her colonoscopy was clear and that she is supposed to have a repeat colonoscopy in 10 years.   The patient's family history consists of a mother and sister who had breast cancer. Her brother had lung cancer. Her father passed away due to liver problems secondary to alcohol use. She denies family history of anemia, bone marrow disorders, or hematologic cancers.   The patient used to work as a Neurosurgeon. She is divorced/widowed. She had 4 children. Denies tobacco, drug, or alcohol use.    HPI  Past Medical History:  Diagnosis Date  . Diabetes mellitus without complication ([place])   . Hypercholesterolemia   . Hypertension   . Sleep apnea     Past Surgical History:  Procedure Laterality Date  . ABDOMINAL HYSTERECTOMY    . BREAST REDUCTION SURGERY    . CARPAL TUNNEL RELEASE    . HERNIA REPAIR    . REDUCTION MAMMAPLASTY     app. 1980    No family history on file.  Social History Social History   Tobacco Use  .  Smoking status: Never Smoker  . Smokeless tobacco: Never Used  Vaping Use  . Vaping Use: Never used  Substance Use Topics  . Alcohol use: No  . Drug use: Never    Allergies  Allergen Reactions  . No Known Allergies   . Clindamycin Nausea And Vomiting  . Clindamycin/Lincomycin Nausea And Vomiting  . Penicillins Rash    Current Outpatient Medications  Medication Sig Dispense Refill  .  amLODipine (NORVASC) 5 MG tablet 1 tablet    . aspirin EC 81 MG tablet Take 81 mg by mouth daily.    . baclofen (LIORESAL) 10 MG tablet 1 tablet with food or milk    . benzonatate (TESSALON) 100 MG capsule benzonatate 100 mg capsule    . celecoxib (CELEBREX) 200 MG capsule Take 200 mg by mouth 2 (two) times daily.    . chlorhexidine (PERIDEX) 0.12 % solution Use as directed 15 mLs in the mouth or throat 2 (two) times daily. 120 mL 0  . Cinnamon 500 MG capsule Take by mouth.    . ferrous sulfate 325 (65 FE) MG tablet 1 tablet    . Fluticasone Furoate 50 MCG/ACT AEPB 2 puffs    . HYDROcodone-acetaminophen (NORCO) 5-325 MG tablet Take 1 tablet by mouth every 6 (six) hours as needed for moderate pain. 12 tablet 0  . ibuprofen (ADVIL,MOTRIN) 800 MG tablet Take 800 mg by mouth every 8 (eight) hours as needed.    . Iron-Vit C-Vit B12-Folic Acid (IRON 233 PLUS PO) Take by mouth.    Marland Kitchen ketotifen (ZADITOR) 0.025 % ophthalmic solution 1 drop into affected eye    . leflunomide (ARAVA) 20 MG tablet 1 tablet    . Melatonin 10 MG TBCR See admin instructions.    . metFORMIN (GLUCOPHAGE) 500 MG tablet Take by mouth 2 (two) times daily with a meal.    . metoprolol (LOPRESSOR) 50 MG tablet Take 50 mg by mouth 2 (two) times daily.    . Multiple Vitamin (MULTI-VITAMIN) tablet Take by mouth.    . omega-3 acid ethyl esters (LOVAZA) 1 g capsule Take by mouth 2 (two) times daily.    Marland Kitchen omeprazole (PRILOSEC) 20 MG capsule Take 20 mg by mouth daily.    . pregabalin (LYRICA) 75 MG capsule TAKE 1 CAPSULE BY MOUTH TWICE DAILY AS NEEDED    . simvastatin (ZOCOR) 40 MG tablet Take 40 mg by mouth daily.    Marland Kitchen terazosin (HYTRIN) 5 MG capsule Take 5 mg by mouth at bedtime.    . traMADol (ULTRAM) 50 MG tablet 1 tablet as needed    . traZODone (DESYREL) 50 MG tablet 1 tablet at bedtime as needed    . triamterene-hydrochlorothiazide (MAXZIDE-25) 37.5-25 MG tablet Take 1 tablet by mouth daily.    . insulin glargine (LANTUS) 100  UNIT/ML injection Inject 22 Units into the skin at bedtime. (Patient not taking: Reported on 08/11/2020)    . magnesium 30 MG tablet Take 250 mg by mouth 2 (two) times daily. (Patient not taking: Reported on 08/11/2020)    . potassium chloride (K-DUR) 10 MEQ tablet Take 10 mEq by mouth daily. (Patient not taking: Reported on 08/11/2020)     No current facility-administered medications for this visit.    REVIEW OF SYSTEMS:   Review of Systems  Constitutional: Positive for 40 lb weight loss in last 3 years. Negative for appetite change, chills, fatigue, or fever. HENT: Negative for mouth sores, nosebleeds, sore throat and trouble swallowing.   Eyes: Negative  for eye problems and icterus.  Respiratory: Negative for cough, hemoptysis, shortness of breath and wheezing.   Cardiovascular: Negative for chest pain and leg swelling.  Gastrointestinal: Negative for abdominal pain, constipation, diarrhea, nausea and vomiting.  Genitourinary: Negative for bladder incontinence, difficulty urinating, dysuria, frequency and hematuria.   Musculoskeletal: Negative for back pain, gait problem, neck pain and neck stiffness.  Skin: Negative for itching and rash.  Neurological: Positive for occasional lightheadedness. Negative for extremity weakness, gait problem, headaches, and seizures.  Hematological: Negative for adenopathy. Does not bruise/bleed easily.  Psychiatric/Behavioral: Negative for confusion, depression and sleep disturbance. The patient is not nervous/anxious.     PHYSICAL EXAMINATION:  Blood pressure (!) 146/75, pulse 94, temperature 97.9 F (36.6 C), temperature source Tympanic, resp. rate 16, height '5\' 3"'  (1.6 m), weight 193 lb 3.2 oz (87.6 kg), SpO2 96 %.  ECOG PERFORMANCE STATUS: 1 - Symptomatic but completely ambulatory  Physical Exam  Constitutional: Oriented to person, place, and time and well-developed, well-nourished, and in no distress.  HENT:  Head: Normocephalic and atraumatic.   Mouth/Throat: Oropharynx is clear and moist. No oropharyngeal exudate.  Eyes: Conjunctivae are normal. Right eye exhibits no discharge. Left eye exhibits no discharge. No scleral icterus.  Neck: Normal range of motion. Neck supple.  Cardiovascular: Normal rate, regular rhythm, normal heart sounds and intact distal pulses.   Pulmonary/Chest: Effort normal and breath sounds normal. No respiratory distress. No wheezes. No rales.  Abdominal: Soft. Bowel sounds are normal. Exhibits no distension and no mass. There is no tenderness.  Musculoskeletal: Normal range of motion. Exhibits no edema.  Lymphadenopathy:    No cervical adenopathy.  Neurological: Alert and oriented to person, place, and time. Exhibits normal muscle tone. Gait normal. Coordination normal. Ambulates with a cane.  Skin: Skin is warm and dry. No rash noted. Not diaphoretic. No erythema. No pallor.  Psychiatric: Mood, memory and judgment normal.  Vitals reviewed.  LABORATORY DATA: Lab Results  Component Value Date   WBC 5.5 08/11/2020   HGB 9.4 (L) 08/11/2020   HCT 30.7 (L) 08/11/2020   MCV 85.8 08/11/2020   PLT 265 08/11/2020      Chemistry      Component Value Date/Time   NA 143 08/11/2020 1248   K 4.0 08/11/2020 1248   CL 106 08/11/2020 1248   CO2 28 08/11/2020 1248   BUN 20 08/11/2020 1248   CREATININE 1.18 (H) 08/11/2020 1248      Component Value Date/Time   CALCIUM 9.6 08/11/2020 1248   ALKPHOS 28 (L) 08/11/2020 1248   AST 20 08/11/2020 1248   ALT 12 08/11/2020 1248   BILITOT 0.4 08/11/2020 1248       RADIOGRAPHIC STUDIES: No results found.  ASSESSMENT: This is a very pleasant 70 year old African American female with referred to the clinic for anemia.    PLAN: The patient was seen with Dr. Julien Nordmann today. Labs were reviewed. Her CBC shows normocytic anemia with a hgb of 9.4. Her MCV is WNl at 85.8. Her platelet count is WNL at 265k. Her WBC is WNL at 5.5. Her CMP is unremarkable except for mild  renal insuffiencey with a creatinine of 1.18. Her iron is low at 40. Her saturation is low at 165%. Her ferritin is WNL likely secondary to her rheumatoid arthritis. Her B12 is low at 152.   We will arrange for weekly B12 injections x4, followed by monthly injections there on after. I will also arrange for IV iron with either  venofer 300 mg x2 or feraheme depending on insurance preference.   We will see her back for a follow up visit in 6-8 weeks for evaluation and repeat blood work.   The patient was also instructed tocontinue totake a oral iron supplement by mouth. He was instructed to takehisoral iron supplement with vitamin C.  She is reportedly up to date with her colonoscopy, however, she was given stool cards to rule out GI blood loss. She was instructed to return these at her earliest convenience.    The patient voices understanding of current disease status and treatment options and is in agreement with the current care plan.  All questions were answered. The patient knows to call the clinic with any problems, questions or concerns. We can certainly see the patient much sooner if necessary.  Thank you so much for allowing me to participate in the care of Axel Filler. I will continue to follow up the patient with you and assist in her care.  I spent 40-54 minutes in this encounter.  Disclaimer: This note was dictated with voice recognition software. Similar sounding words can inadvertently be transcribed and may not be corrected upon review.   Marybel Alcott L Eesa Justiss August 11, 2020, 2:42 PM  ADDENDUM: Hematology/Oncology Attending: I had a face-to-face encounter with the patient today.  I reviewed her records, labs and recommended her care plan.  This is a very pleasant 70 years old African-American female with past medical history significant for fibromyalgia, Sjogren's syndrome, diabetes mellitus, rheumatoid arthritis, GERD, hypertension as well as sleep apnea. The  patient was seen recently by her rheumatologist and currently on treatment with [facility] for rheumatoid arthritis.  During work-up for her condition she was found to have persistent anemia with hemoglobin of 9.5 with normal white blood count and normal platelets count.  She had serum protein electrophoresis that showed no concerning M spike's.  Her last colonoscopy was a year ago and was unremarkable but we do not have any records for the procedure but she was told that her next colonoscopy should be done in 10 years.  The patient denied having any bleeding issues.  She has no dietary restrictions. I had a lengthy discussion with the patient today about her condition.  We order several studies to identify the etiology of her anemia including repeat CBC which showed persistent anemia with hemoglobin of 9.4 and hematocrit 30.7.  The patient has low serum iron of 40 with iron saturation of 16%.  She also has low vitamin B12 level of 152.  We will arrange for the patient to receive iron infusion with Venofer for 2 doses in addition to vitamin B12 injection weekly for the next 4 weeks then monthly. We will arrange for the patient to come back for follow-up visit in around 6-8 weeks. If no improvement in her anemia with these measures, we may consider The patient for a bone marrow biopsy and aspirate to rule out any underlying myelodysplasia. The patient and her son agreed to the current plan. She was advised to call immediately if she has any other concerning symptoms in the interval. The total time spent in the appointment was 60 minutes.  Disclaimer: This note was dictated with voice recognition software. Similar sounding words can inadvertently be transcribed and may be missed upon review. Eilleen Kempf, MD 08/11/20

## 2020-08-11 ENCOUNTER — Inpatient Hospital Stay: Payer: PPO

## 2020-08-11 ENCOUNTER — Encounter: Payer: Self-pay | Admitting: Physician Assistant

## 2020-08-11 ENCOUNTER — Other Ambulatory Visit: Payer: Self-pay

## 2020-08-11 ENCOUNTER — Inpatient Hospital Stay: Payer: PPO | Attending: Physician Assistant | Admitting: Physician Assistant

## 2020-08-11 DIAGNOSIS — D649 Anemia, unspecified: Secondary | ICD-10-CM

## 2020-08-11 DIAGNOSIS — Z880 Allergy status to penicillin: Secondary | ICD-10-CM | POA: Insufficient documentation

## 2020-08-11 DIAGNOSIS — Z803 Family history of malignant neoplasm of breast: Secondary | ICD-10-CM | POA: Insufficient documentation

## 2020-08-11 DIAGNOSIS — K219 Gastro-esophageal reflux disease without esophagitis: Secondary | ICD-10-CM | POA: Insufficient documentation

## 2020-08-11 DIAGNOSIS — R61 Generalized hyperhidrosis: Secondary | ICD-10-CM | POA: Diagnosis not present

## 2020-08-11 DIAGNOSIS — G4730 Sleep apnea, unspecified: Secondary | ICD-10-CM | POA: Insufficient documentation

## 2020-08-11 DIAGNOSIS — I10 Essential (primary) hypertension: Secondary | ICD-10-CM | POA: Diagnosis not present

## 2020-08-11 DIAGNOSIS — R42 Dizziness and giddiness: Secondary | ICD-10-CM | POA: Diagnosis not present

## 2020-08-11 DIAGNOSIS — M3500 Sicca syndrome, unspecified: Secondary | ICD-10-CM | POA: Diagnosis not present

## 2020-08-11 DIAGNOSIS — E538 Deficiency of other specified B group vitamins: Secondary | ICD-10-CM | POA: Insufficient documentation

## 2020-08-11 DIAGNOSIS — E119 Type 2 diabetes mellitus without complications: Secondary | ICD-10-CM | POA: Insufficient documentation

## 2020-08-11 DIAGNOSIS — Z7982 Long term (current) use of aspirin: Secondary | ICD-10-CM | POA: Insufficient documentation

## 2020-08-11 DIAGNOSIS — M797 Fibromyalgia: Secondary | ICD-10-CM | POA: Insufficient documentation

## 2020-08-11 DIAGNOSIS — E785 Hyperlipidemia, unspecified: Secondary | ICD-10-CM | POA: Insufficient documentation

## 2020-08-11 DIAGNOSIS — Z811 Family history of alcohol abuse and dependence: Secondary | ICD-10-CM | POA: Insufficient documentation

## 2020-08-11 DIAGNOSIS — Z79899 Other long term (current) drug therapy: Secondary | ICD-10-CM | POA: Insufficient documentation

## 2020-08-11 DIAGNOSIS — Z801 Family history of malignant neoplasm of trachea, bronchus and lung: Secondary | ICD-10-CM | POA: Diagnosis not present

## 2020-08-11 DIAGNOSIS — Z881 Allergy status to other antibiotic agents status: Secondary | ICD-10-CM | POA: Insufficient documentation

## 2020-08-11 DIAGNOSIS — M069 Rheumatoid arthritis, unspecified: Secondary | ICD-10-CM | POA: Diagnosis not present

## 2020-08-11 LAB — CMP (CANCER CENTER ONLY)
ALT: 12 U/L (ref 0–44)
AST: 20 U/L (ref 15–41)
Albumin: 3.5 g/dL (ref 3.5–5.0)
Alkaline Phosphatase: 28 U/L — ABNORMAL LOW (ref 38–126)
Anion gap: 9 (ref 5–15)
BUN: 20 mg/dL (ref 8–23)
CO2: 28 mmol/L (ref 22–32)
Calcium: 9.6 mg/dL (ref 8.9–10.3)
Chloride: 106 mmol/L (ref 98–111)
Creatinine: 1.18 mg/dL — ABNORMAL HIGH (ref 0.44–1.00)
GFR, Estimated: 50 mL/min — ABNORMAL LOW (ref >60)
Glucose, Bld: 95 mg/dL (ref 70–99)
Potassium: 4 mmol/L (ref 3.5–5.1)
Sodium: 143 mmol/L (ref 135–145)
Total Bilirubin: 0.4 mg/dL (ref 0.3–1.2)
Total Protein: 8 g/dL (ref 6.5–8.1)

## 2020-08-11 LAB — CBC WITH DIFFERENTIAL (CANCER CENTER ONLY)
Abs Immature Granulocytes: 0.01 10*3/uL (ref 0.00–0.07)
Basophils Absolute: 0.1 10*3/uL (ref 0.0–0.1)
Basophils Relative: 1 %
Eosinophils Absolute: 0.2 10*3/uL (ref 0.0–0.5)
Eosinophils Relative: 3 %
HCT: 30.7 % — ABNORMAL LOW (ref 36.0–46.0)
Hemoglobin: 9.4 g/dL — ABNORMAL LOW (ref 12.0–15.0)
Immature Granulocytes: 0 %
Lymphocytes Relative: 24 %
Lymphs Abs: 1.3 10*3/uL (ref 0.7–4.0)
MCH: 26.3 pg (ref 26.0–34.0)
MCHC: 30.6 g/dL (ref 30.0–36.0)
MCV: 85.8 fL (ref 80.0–100.0)
Monocytes Absolute: 0.6 10*3/uL (ref 0.1–1.0)
Monocytes Relative: 10 %
Neutro Abs: 3.4 10*3/uL (ref 1.7–7.7)
Neutrophils Relative %: 62 %
Platelet Count: 265 10*3/uL (ref 150–400)
RBC: 3.58 MIL/uL — ABNORMAL LOW (ref 3.87–5.11)
RDW: 16.5 % — ABNORMAL HIGH (ref 11.5–15.5)
WBC Count: 5.5 10*3/uL (ref 4.0–10.5)
nRBC: 0 % (ref 0.0–0.2)

## 2020-08-11 LAB — IRON AND TIBC
Iron: 40 ug/dL — ABNORMAL LOW (ref 41–142)
Saturation Ratios: 16 % — ABNORMAL LOW (ref 21–57)
TIBC: 244 ug/dL (ref 236–444)
UIBC: 204 ug/dL (ref 120–384)

## 2020-08-11 LAB — VITAMIN B12: Vitamin B-12: 152 pg/mL — ABNORMAL LOW (ref 180–914)

## 2020-08-11 LAB — FERRITIN: Ferritin: 154 ng/mL (ref 11–307)

## 2020-08-11 LAB — FOLATE: Folate: 39.8 ng/mL (ref >5.9)

## 2020-08-12 ENCOUNTER — Other Ambulatory Visit: Payer: Self-pay | Admitting: Physician Assistant

## 2020-08-13 ENCOUNTER — Other Ambulatory Visit: Payer: Self-pay | Admitting: Physician Assistant

## 2020-08-14 ENCOUNTER — Telehealth: Payer: Self-pay | Admitting: Physician Assistant

## 2020-08-14 NOTE — Telephone Encounter (Signed)
Scheduled per 03/15 scheduled message, patient has been called and voicemail was left.

## 2020-08-20 ENCOUNTER — Other Ambulatory Visit: Payer: Self-pay | Admitting: *Deleted

## 2020-08-20 ENCOUNTER — Telehealth: Payer: Self-pay

## 2020-08-20 ENCOUNTER — Inpatient Hospital Stay: Payer: PPO

## 2020-08-20 DIAGNOSIS — Z9989 Dependence on other enabling machines and devices: Secondary | ICD-10-CM | POA: Diagnosis not present

## 2020-08-20 DIAGNOSIS — M545 Low back pain, unspecified: Secondary | ICD-10-CM | POA: Diagnosis not present

## 2020-08-20 DIAGNOSIS — M5136 Other intervertebral disc degeneration, lumbar region: Secondary | ICD-10-CM | POA: Diagnosis not present

## 2020-08-20 DIAGNOSIS — R209 Unspecified disturbances of skin sensation: Secondary | ICD-10-CM | POA: Diagnosis not present

## 2020-08-20 DIAGNOSIS — M5137 Other intervertebral disc degeneration, lumbosacral region: Secondary | ICD-10-CM | POA: Diagnosis not present

## 2020-08-20 DIAGNOSIS — I10 Essential (primary) hypertension: Secondary | ICD-10-CM | POA: Diagnosis not present

## 2020-08-20 DIAGNOSIS — G4733 Obstructive sleep apnea (adult) (pediatric): Secondary | ICD-10-CM | POA: Diagnosis not present

## 2020-08-20 DIAGNOSIS — E119 Type 2 diabetes mellitus without complications: Secondary | ICD-10-CM | POA: Diagnosis not present

## 2020-08-20 DIAGNOSIS — D649 Anemia, unspecified: Secondary | ICD-10-CM

## 2020-08-20 LAB — OCCULT BLOOD X 1 CARD TO LAB, STOOL
Fecal Occult Bld: NEGATIVE
Fecal Occult Bld: NEGATIVE
Fecal Occult Bld: NEGATIVE

## 2020-08-20 NOTE — Telephone Encounter (Signed)
Notification received that pt no showed her infusion appt at [facility].  I attempted to call the pt a few times but her phone rings straight to VM. I then called her daughter, Meredith Cline and advised of the missing appt. I also advised that pt has another infusion scheduled for 3/31. She states she will let her mother know and reminder her.

## 2020-08-26 ENCOUNTER — Telehealth: Payer: Self-pay | Admitting: Internal Medicine

## 2020-08-26 NOTE — Telephone Encounter (Signed)
Scheduled per sch msg. Called and spoke with patient. Confirmed appts  

## 2020-08-27 ENCOUNTER — Ambulatory Visit: Payer: PPO

## 2020-08-27 DIAGNOSIS — M47816 Spondylosis without myelopathy or radiculopathy, lumbar region: Secondary | ICD-10-CM | POA: Diagnosis not present

## 2020-08-27 DIAGNOSIS — Z6834 Body mass index (BMI) 34.0-34.9, adult: Secondary | ICD-10-CM | POA: Diagnosis not present

## 2020-08-27 DIAGNOSIS — Z9181 History of falling: Secondary | ICD-10-CM | POA: Diagnosis not present

## 2020-08-27 DIAGNOSIS — M62838 Other muscle spasm: Secondary | ICD-10-CM | POA: Diagnosis not present

## 2020-08-27 DIAGNOSIS — Z79899 Other long term (current) drug therapy: Secondary | ICD-10-CM | POA: Diagnosis not present

## 2020-08-27 DIAGNOSIS — G8929 Other chronic pain: Secondary | ICD-10-CM | POA: Diagnosis not present

## 2020-08-27 DIAGNOSIS — M545 Low back pain, unspecified: Secondary | ICD-10-CM | POA: Diagnosis not present

## 2020-09-01 ENCOUNTER — Other Ambulatory Visit: Payer: Self-pay

## 2020-09-01 ENCOUNTER — Inpatient Hospital Stay: Payer: PPO | Attending: Physician Assistant

## 2020-09-01 VITALS — BP 139/62 | HR 71 | Temp 98.6°F | Resp 16

## 2020-09-01 DIAGNOSIS — M3500 Sicca syndrome, unspecified: Secondary | ICD-10-CM | POA: Insufficient documentation

## 2020-09-01 DIAGNOSIS — E119 Type 2 diabetes mellitus without complications: Secondary | ICD-10-CM | POA: Diagnosis not present

## 2020-09-01 DIAGNOSIS — D649 Anemia, unspecified: Secondary | ICD-10-CM | POA: Diagnosis not present

## 2020-09-01 DIAGNOSIS — I10 Essential (primary) hypertension: Secondary | ICD-10-CM | POA: Diagnosis not present

## 2020-09-01 DIAGNOSIS — Z881 Allergy status to other antibiotic agents status: Secondary | ICD-10-CM | POA: Diagnosis not present

## 2020-09-01 DIAGNOSIS — G4730 Sleep apnea, unspecified: Secondary | ICD-10-CM | POA: Insufficient documentation

## 2020-09-01 DIAGNOSIS — D509 Iron deficiency anemia, unspecified: Secondary | ICD-10-CM

## 2020-09-01 DIAGNOSIS — Z7982 Long term (current) use of aspirin: Secondary | ICD-10-CM | POA: Insufficient documentation

## 2020-09-01 DIAGNOSIS — Z880 Allergy status to penicillin: Secondary | ICD-10-CM | POA: Insufficient documentation

## 2020-09-01 DIAGNOSIS — Z803 Family history of malignant neoplasm of breast: Secondary | ICD-10-CM | POA: Insufficient documentation

## 2020-09-01 DIAGNOSIS — M069 Rheumatoid arthritis, unspecified: Secondary | ICD-10-CM | POA: Insufficient documentation

## 2020-09-01 DIAGNOSIS — Z79899 Other long term (current) drug therapy: Secondary | ICD-10-CM | POA: Diagnosis not present

## 2020-09-01 DIAGNOSIS — E785 Hyperlipidemia, unspecified: Secondary | ICD-10-CM | POA: Diagnosis not present

## 2020-09-01 DIAGNOSIS — R61 Generalized hyperhidrosis: Secondary | ICD-10-CM | POA: Diagnosis not present

## 2020-09-01 DIAGNOSIS — R42 Dizziness and giddiness: Secondary | ICD-10-CM | POA: Diagnosis not present

## 2020-09-01 DIAGNOSIS — Z801 Family history of malignant neoplasm of trachea, bronchus and lung: Secondary | ICD-10-CM | POA: Insufficient documentation

## 2020-09-01 DIAGNOSIS — E538 Deficiency of other specified B group vitamins: Secondary | ICD-10-CM | POA: Diagnosis not present

## 2020-09-01 DIAGNOSIS — M797 Fibromyalgia: Secondary | ICD-10-CM | POA: Diagnosis not present

## 2020-09-01 DIAGNOSIS — K219 Gastro-esophageal reflux disease without esophagitis: Secondary | ICD-10-CM | POA: Insufficient documentation

## 2020-09-01 DIAGNOSIS — Z811 Family history of alcohol abuse and dependence: Secondary | ICD-10-CM | POA: Insufficient documentation

## 2020-09-01 MED ORDER — CYANOCOBALAMIN 1000 MCG/ML IJ SOLN
INTRAMUSCULAR | Status: AC
  Filled 2020-09-01: qty 1

## 2020-09-01 MED ORDER — CYANOCOBALAMIN 1000 MCG/ML IJ SOLN
1000.0000 ug | Freq: Once | INTRAMUSCULAR | Status: AC
  Administered 2020-09-01: 1000 ug via INTRAMUSCULAR

## 2020-09-01 MED ORDER — SODIUM CHLORIDE 0.9 % IV SOLN
Freq: Once | INTRAVENOUS | Status: AC
  Filled 2020-09-01: qty 250

## 2020-09-01 MED ORDER — SODIUM CHLORIDE 0.9 % IV SOLN
300.0000 mg | Freq: Once | INTRAVENOUS | Status: AC
  Administered 2020-09-01: 300 mg via INTRAVENOUS
  Filled 2020-09-01: qty 10

## 2020-09-01 NOTE — Patient Instructions (Signed)

## 2020-09-03 DIAGNOSIS — R0902 Hypoxemia: Secondary | ICD-10-CM | POA: Diagnosis not present

## 2020-09-03 DIAGNOSIS — G4736 Sleep related hypoventilation in conditions classified elsewhere: Secondary | ICD-10-CM | POA: Diagnosis not present

## 2020-09-03 DIAGNOSIS — G4733 Obstructive sleep apnea (adult) (pediatric): Secondary | ICD-10-CM | POA: Diagnosis not present

## 2020-09-03 DIAGNOSIS — G4761 Periodic limb movement disorder: Secondary | ICD-10-CM | POA: Diagnosis not present

## 2020-09-08 ENCOUNTER — Other Ambulatory Visit: Payer: Self-pay

## 2020-09-08 ENCOUNTER — Inpatient Hospital Stay: Payer: PPO

## 2020-09-08 VITALS — BP 121/58 | HR 69 | Temp 98.3°F | Resp 18

## 2020-09-08 DIAGNOSIS — E538 Deficiency of other specified B group vitamins: Secondary | ICD-10-CM

## 2020-09-08 DIAGNOSIS — D649 Anemia, unspecified: Secondary | ICD-10-CM | POA: Diagnosis not present

## 2020-09-08 DIAGNOSIS — D509 Iron deficiency anemia, unspecified: Secondary | ICD-10-CM

## 2020-09-08 MED ORDER — CYANOCOBALAMIN 1000 MCG/ML IJ SOLN
1000.0000 ug | Freq: Once | INTRAMUSCULAR | Status: AC
  Administered 2020-09-08: 1000 ug via INTRAMUSCULAR

## 2020-09-08 MED ORDER — SODIUM CHLORIDE 0.9 % IV SOLN
300.0000 mg | Freq: Once | INTRAVENOUS | Status: AC
  Administered 2020-09-08: 300 mg via INTRAVENOUS
  Filled 2020-09-08: qty 10

## 2020-09-08 MED ORDER — ACETAMINOPHEN 325 MG PO TABS
ORAL_TABLET | ORAL | Status: AC
  Filled 2020-09-08: qty 2

## 2020-09-08 MED ORDER — SODIUM CHLORIDE 0.9 % IV SOLN
INTRAVENOUS | Status: DC
  Filled 2020-09-08: qty 250

## 2020-09-08 MED ORDER — CYANOCOBALAMIN 1000 MCG/ML IJ SOLN
INTRAMUSCULAR | Status: AC
  Filled 2020-09-08: qty 1

## 2020-09-08 MED ORDER — ACETAMINOPHEN 325 MG PO TABS
650.0000 mg | ORAL_TABLET | Freq: Once | ORAL | Status: AC
  Administered 2020-09-08: 650 mg via ORAL

## 2020-09-08 NOTE — Progress Notes (Signed)
Patient was observed for 30 minutes post infusion with no complaints. Upon departure, vitals stable and patient had no complaints.

## 2020-09-08 NOTE — Patient Instructions (Signed)

## 2020-09-15 ENCOUNTER — Other Ambulatory Visit: Payer: Self-pay

## 2020-09-15 ENCOUNTER — Inpatient Hospital Stay: Payer: PPO

## 2020-09-15 VITALS — BP 133/56 | HR 70 | Temp 98.4°F | Resp 18

## 2020-09-15 DIAGNOSIS — E538 Deficiency of other specified B group vitamins: Secondary | ICD-10-CM

## 2020-09-15 DIAGNOSIS — D649 Anemia, unspecified: Secondary | ICD-10-CM | POA: Diagnosis not present

## 2020-09-15 DIAGNOSIS — D509 Iron deficiency anemia, unspecified: Secondary | ICD-10-CM

## 2020-09-15 MED ORDER — SODIUM CHLORIDE 0.9 % IV SOLN
INTRAVENOUS | Status: DC
  Filled 2020-09-15: qty 250

## 2020-09-15 MED ORDER — ACETAMINOPHEN 325 MG PO TABS
ORAL_TABLET | ORAL | Status: AC
  Filled 2020-09-15: qty 2

## 2020-09-15 MED ORDER — ACETAMINOPHEN 325 MG PO TABS
650.0000 mg | ORAL_TABLET | Freq: Once | ORAL | Status: AC
  Administered 2020-09-15: 650 mg via ORAL

## 2020-09-15 MED ORDER — CYANOCOBALAMIN 1000 MCG/ML IJ SOLN
1000.0000 ug | Freq: Once | INTRAMUSCULAR | Status: AC
  Administered 2020-09-15: 1000 ug via INTRAMUSCULAR

## 2020-09-15 MED ORDER — DIPHENHYDRAMINE HCL 25 MG PO CAPS
ORAL_CAPSULE | ORAL | Status: AC
  Filled 2020-09-15: qty 1

## 2020-09-15 MED ORDER — DIPHENHYDRAMINE HCL 25 MG PO CAPS
25.0000 mg | ORAL_CAPSULE | Freq: Once | ORAL | Status: AC
  Administered 2020-09-15: 25 mg via ORAL

## 2020-09-15 MED ORDER — CYANOCOBALAMIN 1000 MCG/ML IJ SOLN
INTRAMUSCULAR | Status: AC
  Filled 2020-09-15: qty 1

## 2020-09-15 MED ORDER — SODIUM CHLORIDE 0.9 % IV SOLN
300.0000 mg | Freq: Once | INTRAVENOUS | Status: AC
  Administered 2020-09-15: 300 mg via INTRAVENOUS
  Filled 2020-09-15: qty 10

## 2020-09-15 NOTE — Patient Instructions (Signed)

## 2020-09-17 DIAGNOSIS — I10 Essential (primary) hypertension: Secondary | ICD-10-CM | POA: Diagnosis not present

## 2020-09-17 DIAGNOSIS — Z9989 Dependence on other enabling machines and devices: Secondary | ICD-10-CM | POA: Diagnosis not present

## 2020-09-17 DIAGNOSIS — G4733 Obstructive sleep apnea (adult) (pediatric): Secondary | ICD-10-CM | POA: Diagnosis not present

## 2020-09-17 DIAGNOSIS — E119 Type 2 diabetes mellitus without complications: Secondary | ICD-10-CM | POA: Diagnosis not present

## 2020-10-08 DIAGNOSIS — E119 Type 2 diabetes mellitus without complications: Secondary | ICD-10-CM | POA: Diagnosis not present

## 2020-10-08 DIAGNOSIS — Z9181 History of falling: Secondary | ICD-10-CM | POA: Diagnosis not present

## 2020-10-08 DIAGNOSIS — Z6834 Body mass index (BMI) 34.0-34.9, adult: Secondary | ICD-10-CM | POA: Diagnosis not present

## 2020-10-08 DIAGNOSIS — Z79899 Other long term (current) drug therapy: Secondary | ICD-10-CM | POA: Diagnosis not present

## 2020-10-08 DIAGNOSIS — M62838 Other muscle spasm: Secondary | ICD-10-CM | POA: Diagnosis not present

## 2020-10-08 DIAGNOSIS — M47816 Spondylosis without myelopathy or radiculopathy, lumbar region: Secondary | ICD-10-CM | POA: Diagnosis not present

## 2020-10-08 DIAGNOSIS — K149 Disease of tongue, unspecified: Secondary | ICD-10-CM | POA: Diagnosis not present

## 2020-10-20 ENCOUNTER — Other Ambulatory Visit: Payer: Self-pay | Admitting: Medical Oncology

## 2020-10-20 ENCOUNTER — Other Ambulatory Visit: Payer: Self-pay

## 2020-10-20 ENCOUNTER — Inpatient Hospital Stay: Payer: PPO | Attending: Physician Assistant | Admitting: Internal Medicine

## 2020-10-20 ENCOUNTER — Inpatient Hospital Stay: Payer: PPO

## 2020-10-20 VITALS — BP 140/70 | HR 88 | Temp 97.8°F | Resp 19 | Ht 63.0 in | Wt 193.2 lb

## 2020-10-20 DIAGNOSIS — Z801 Family history of malignant neoplasm of trachea, bronchus and lung: Secondary | ICD-10-CM | POA: Insufficient documentation

## 2020-10-20 DIAGNOSIS — D508 Other iron deficiency anemias: Secondary | ICD-10-CM | POA: Diagnosis not present

## 2020-10-20 DIAGNOSIS — M797 Fibromyalgia: Secondary | ICD-10-CM | POA: Insufficient documentation

## 2020-10-20 DIAGNOSIS — Z881 Allergy status to other antibiotic agents status: Secondary | ICD-10-CM | POA: Diagnosis not present

## 2020-10-20 DIAGNOSIS — D649 Anemia, unspecified: Secondary | ICD-10-CM | POA: Insufficient documentation

## 2020-10-20 DIAGNOSIS — D509 Iron deficiency anemia, unspecified: Secondary | ICD-10-CM

## 2020-10-20 DIAGNOSIS — Z79899 Other long term (current) drug therapy: Secondary | ICD-10-CM | POA: Diagnosis not present

## 2020-10-20 DIAGNOSIS — E538 Deficiency of other specified B group vitamins: Secondary | ICD-10-CM | POA: Insufficient documentation

## 2020-10-20 DIAGNOSIS — Z880 Allergy status to penicillin: Secondary | ICD-10-CM | POA: Diagnosis not present

## 2020-10-20 DIAGNOSIS — Z7982 Long term (current) use of aspirin: Secondary | ICD-10-CM | POA: Diagnosis not present

## 2020-10-20 DIAGNOSIS — M25512 Pain in left shoulder: Secondary | ICD-10-CM | POA: Diagnosis not present

## 2020-10-20 DIAGNOSIS — Z803 Family history of malignant neoplasm of breast: Secondary | ICD-10-CM | POA: Insufficient documentation

## 2020-10-20 DIAGNOSIS — Z811 Family history of alcohol abuse and dependence: Secondary | ICD-10-CM | POA: Diagnosis not present

## 2020-10-20 DIAGNOSIS — R5383 Other fatigue: Secondary | ICD-10-CM | POA: Diagnosis not present

## 2020-10-20 DIAGNOSIS — M25511 Pain in right shoulder: Secondary | ICD-10-CM | POA: Diagnosis not present

## 2020-10-20 LAB — CBC WITH DIFFERENTIAL (CANCER CENTER ONLY)
Abs Immature Granulocytes: 0 10*3/uL (ref 0.00–0.07)
Basophils Absolute: 0.1 10*3/uL (ref 0.0–0.1)
Basophils Relative: 1 %
Eosinophils Absolute: 0.1 10*3/uL (ref 0.0–0.5)
Eosinophils Relative: 3 %
HCT: 33.8 % — ABNORMAL LOW (ref 36.0–46.0)
Hemoglobin: 10.5 g/dL — ABNORMAL LOW (ref 12.0–15.0)
Immature Granulocytes: 0 %
Lymphocytes Relative: 33 %
Lymphs Abs: 1.4 10*3/uL (ref 0.7–4.0)
MCH: 26.9 pg (ref 26.0–34.0)
MCHC: 31.1 g/dL (ref 30.0–36.0)
MCV: 86.4 fL (ref 80.0–100.0)
Monocytes Absolute: 0.5 10*3/uL (ref 0.1–1.0)
Monocytes Relative: 12 %
Neutro Abs: 2.1 10*3/uL (ref 1.7–7.7)
Neutrophils Relative %: 51 %
Platelet Count: 249 10*3/uL (ref 150–400)
RBC: 3.91 MIL/uL (ref 3.87–5.11)
RDW: 17.1 % — ABNORMAL HIGH (ref 11.5–15.5)
WBC Count: 4.2 10*3/uL (ref 4.0–10.5)
nRBC: 0 % (ref 0.0–0.2)

## 2020-10-20 LAB — CMP (CANCER CENTER ONLY)
ALT: 12 U/L (ref 0–44)
AST: 19 U/L (ref 15–41)
Albumin: 3.4 g/dL — ABNORMAL LOW (ref 3.5–5.0)
Alkaline Phosphatase: 24 U/L — ABNORMAL LOW (ref 38–126)
Anion gap: 10 (ref 5–15)
BUN: 21 mg/dL (ref 8–23)
CO2: 28 mmol/L (ref 22–32)
Calcium: 9.9 mg/dL (ref 8.9–10.3)
Chloride: 105 mmol/L (ref 98–111)
Creatinine: 1.23 mg/dL — ABNORMAL HIGH (ref 0.44–1.00)
GFR, Estimated: 47 mL/min — ABNORMAL LOW (ref >60)
Glucose, Bld: 85 mg/dL (ref 70–99)
Potassium: 4.3 mmol/L (ref 3.5–5.1)
Sodium: 143 mmol/L (ref 135–145)
Total Bilirubin: 0.3 mg/dL (ref 0.3–1.2)
Total Protein: 7.9 g/dL (ref 6.5–8.1)

## 2020-10-20 LAB — IRON AND TIBC
Iron: 49 ug/dL (ref 41–142)
Saturation Ratios: 23 % (ref 21–57)
TIBC: 211 ug/dL — ABNORMAL LOW (ref 236–444)
UIBC: 162 ug/dL (ref 120–384)

## 2020-10-20 LAB — FERRITIN: Ferritin: 675 ng/mL — ABNORMAL HIGH (ref 11–307)

## 2020-10-20 NOTE — Progress Notes (Signed)
[place] Telephone:(336) [phone]   [phone]) [phone]  OFFICE PROGRESS NOTE  Carylon Perches, NP [place] [address]  DIAGNOSIS: Normocytic anemia with a combination of iron deficiency as well as vitamin B12 deficiency.  PRIOR THERAPY: She is status post treatment with Venofer 200 Mg IV weekly for 4 doses in addition to vitamin B12 injections.  CURRENT THERAPY: Ferrous sulfate 325 mg p.o. daily.  INTERVAL HISTORY: Meredith Cline 70 y.o. female returns to the clinic today for follow-up visit.  The patient is feeling fine today with no concerning complaints except for pain in the shoulder bilaterally secondary to fibromyalgia.  She also has mild fatigue.  She did not notice a lot of difference after receiving the iron infusion or the vitamin B12 injection.  She is currently on oral ferrous sulfate once daily.  She is tolerating it fairly well with no concerning issues.  She denied having any current chest pain, shortness of breath, cough or hemoptysis.  She denied having any nausea, vomiting, diarrhea or constipation.  She is here today for evaluation and repeat blood work.  MEDICAL HISTORY: Past Medical History:  Diagnosis Date  . Diabetes mellitus without complication ([place])   . Hypercholesterolemia   . Hypertension   . Sleep apnea     ALLERGIES:  is allergic to no known allergies, clindamycin, clindamycin/lincomycin, and penicillins.  MEDICATIONS:  Current Outpatient Medications  Medication Sig Dispense Refill  . amLODipine (NORVASC) 5 MG tablet 1 tablet    . aspirin EC 81 MG tablet Take 81 mg by mouth daily.    . baclofen (LIORESAL) 10 MG tablet 1 tablet with food or milk    . benzonatate (TESSALON) 100 MG capsule benzonatate 100 mg capsule    . celecoxib (CELEBREX) 200 MG capsule Take 200 mg by mouth 2 (two) times daily.    . chlorhexidine (PERIDEX) 0.12 % solution Use as directed 15 mLs in the mouth or throat 2 (two) times daily. 120  mL 0  . Cinnamon 500 MG capsule Take by mouth.    . ferrous sulfate 325 (65 FE) MG tablet 1 tablet    . Fluticasone Furoate 50 MCG/ACT AEPB 2 puffs    . HYDROcodone-acetaminophen (NORCO) 5-325 MG tablet Take 1 tablet by mouth every 6 (six) hours as needed for moderate pain. 12 tablet 0  . ibuprofen (ADVIL,MOTRIN) 800 MG tablet Take 800 mg by mouth every 8 (eight) hours as needed.    . insulin glargine (LANTUS) 100 UNIT/ML injection Inject 22 Units into the skin at bedtime. (Patient not taking: Reported on 08/11/2020)    . Iron-Vit C-Vit B12-Folic Acid (IRON 275 PLUS PO) Take by mouth.    Marland Kitchen ketotifen (ZADITOR) 0.025 % ophthalmic solution 1 drop into affected eye    . leflunomide (ARAVA) 20 MG tablet 1 tablet    . magnesium 30 MG tablet Take 250 mg by mouth 2 (two) times daily. (Patient not taking: Reported on 08/11/2020)    . Melatonin 10 MG TBCR See admin instructions.    . metFORMIN (GLUCOPHAGE) 500 MG tablet Take by mouth 2 (two) times daily with a meal.    . metoprolol (LOPRESSOR) 50 MG tablet Take 50 mg by mouth 2 (two) times daily.    . Multiple Vitamin (MULTI-VITAMIN) tablet Take by mouth.    . omega-3 acid ethyl esters (LOVAZA) 1 g capsule Take by mouth 2 (two) times daily.    Marland Kitchen omeprazole (PRILOSEC) 20 MG capsule  Take 20 mg by mouth daily.    . potassium chloride (K-DUR) 10 MEQ tablet Take 10 mEq by mouth daily. (Patient not taking: Reported on 08/11/2020)    . pregabalin (LYRICA) 75 MG capsule TAKE 1 CAPSULE BY MOUTH TWICE DAILY AS NEEDED    . simvastatin (ZOCOR) 40 MG tablet Take 40 mg by mouth daily.    Marland Kitchen terazosin (HYTRIN) 5 MG capsule Take 5 mg by mouth at bedtime.    . traMADol (ULTRAM) 50 MG tablet 1 tablet as needed    . traZODone (DESYREL) 50 MG tablet 1 tablet at bedtime as needed    . triamterene-hydrochlorothiazide (MAXZIDE-25) 37.5-25 MG tablet Take 1 tablet by mouth daily.     No current facility-administered medications for this visit.    SURGICAL HISTORY:  Past  Surgical History:  Procedure Laterality Date  . ABDOMINAL HYSTERECTOMY    . BREAST REDUCTION SURGERY    . CARPAL TUNNEL RELEASE    . HERNIA REPAIR    . REDUCTION MAMMAPLASTY     app. 1980    REVIEW OF SYSTEMS:  A comprehensive review of systems was negative except for: Constitutional: positive for fatigue Musculoskeletal: positive for arthralgias   PHYSICAL EXAMINATION: General appearance: alert, cooperative, fatigued and no distress Head: Normocephalic, without obvious abnormality, atraumatic Neck: no adenopathy, no JVD, supple, symmetrical, trachea midline and thyroid not enlarged, symmetric, no tenderness/mass/nodules Lymph nodes: Cervical, supraclavicular, and axillary nodes normal. Resp: clear to auscultation bilaterally Back: symmetric, no curvature. ROM normal. No CVA tenderness. Cardio: regular rate and rhythm, S1, S2 normal, no murmur, click, rub or gallop GI: soft, non-tender; bowel sounds normal; no masses,  no organomegaly Extremities: extremities normal, atraumatic, no cyanosis or edema  ECOG PERFORMANCE STATUS: 1 - Symptomatic but completely ambulatory  Blood pressure 140/70, pulse 88, temperature 97.8 F (36.6 C), temperature source Tympanic, resp. rate 19, height 5\' 3"  (1.6 m), weight 193 lb 3.2 oz (87.6 kg), SpO2 94 %.  LABORATORY DATA: Lab Results  Component Value Date   WBC 4.2 10/20/2020   HGB 10.5 (L) 10/20/2020   HCT 33.8 (L) 10/20/2020   MCV 86.4 10/20/2020   PLT 249 10/20/2020      Chemistry      Component Value Date/Time   NA 143 08/11/2020 1248   K 4.0 08/11/2020 1248   CL 106 08/11/2020 1248   CO2 28 08/11/2020 1248   BUN 20 08/11/2020 1248   CREATININE 1.18 (H) 08/11/2020 1248      Component Value Date/Time   CALCIUM 9.6 08/11/2020 1248   ALKPHOS 28 (L) 08/11/2020 1248   AST 20 08/11/2020 1248   ALT 12 08/11/2020 1248   BILITOT 0.4 08/11/2020 1248       RADIOGRAPHIC STUDIES: No results found.  ASSESSMENT AND PLAN: This is a very  pleasant 70 years old African-American female with normocytic anemia likely secondary to mixture of iron deficiency as well as vitamin B12 deficiency.  The patient was treated with iron infusion with Venofer in addition to weekly vitamin B12 injection. She has improvement of her hemoglobin and hematocrit as well as the iron study.  Ferritin level is still pending I recommended for the patient to continue on the oral iron tablet for now. I will see her back for follow-up visit in 3 months for evaluation with repeat CBC, iron study, ferritin and vitamin B12 level. The patient was advised to call immediately if she has any other concerning symptoms in the interval. The patient voices understanding of  current disease status and treatment options and is in agreement with the current care plan.  All questions were answered. The patient knows to call the clinic with any problems, questions or concerns. We can certainly see the patient much sooner if necessary.  The total time spent in the appointment was 20 minutes.  Disclaimer: This note was dictated with voice recognition software. Similar sounding words can inadvertently be transcribed and may not be corrected upon review.

## 2020-10-22 DIAGNOSIS — I10 Essential (primary) hypertension: Secondary | ICD-10-CM | POA: Diagnosis not present

## 2020-10-23 ENCOUNTER — Telehealth: Payer: Self-pay | Admitting: Internal Medicine

## 2020-10-23 NOTE — Telephone Encounter (Signed)
Scheduled per los. Called and left msg. Mailed printout  °

## 2020-10-29 DIAGNOSIS — M25511 Pain in right shoulder: Secondary | ICD-10-CM | POA: Diagnosis not present

## 2020-10-29 DIAGNOSIS — Z79899 Other long term (current) drug therapy: Secondary | ICD-10-CM | POA: Diagnosis not present

## 2020-10-29 DIAGNOSIS — M25512 Pain in left shoulder: Secondary | ICD-10-CM | POA: Diagnosis not present

## 2020-10-29 DIAGNOSIS — M0579 Rheumatoid arthritis with rheumatoid factor of multiple sites without organ or systems involvement: Secondary | ICD-10-CM | POA: Diagnosis not present

## 2020-10-29 DIAGNOSIS — M2550 Pain in unspecified joint: Secondary | ICD-10-CM | POA: Diagnosis not present

## 2020-10-29 DIAGNOSIS — D649 Anemia, unspecified: Secondary | ICD-10-CM | POA: Diagnosis not present

## 2020-10-29 DIAGNOSIS — M549 Dorsalgia, unspecified: Secondary | ICD-10-CM | POA: Diagnosis not present

## 2020-10-29 DIAGNOSIS — M19012 Primary osteoarthritis, left shoulder: Secondary | ICD-10-CM | POA: Diagnosis not present

## 2020-10-29 DIAGNOSIS — M545 Low back pain, unspecified: Secondary | ICD-10-CM | POA: Diagnosis not present

## 2020-10-29 DIAGNOSIS — M3500 Sicca syndrome, unspecified: Secondary | ICD-10-CM | POA: Diagnosis not present

## 2020-10-29 DIAGNOSIS — R700 Elevated erythrocyte sedimentation rate: Secondary | ICD-10-CM | POA: Diagnosis not present

## 2020-10-29 DIAGNOSIS — M797 Fibromyalgia: Secondary | ICD-10-CM | POA: Diagnosis not present

## 2020-10-29 DIAGNOSIS — M1990 Unspecified osteoarthritis, unspecified site: Secondary | ICD-10-CM | POA: Diagnosis not present

## 2020-10-29 DIAGNOSIS — E1169 Type 2 diabetes mellitus with other specified complication: Secondary | ICD-10-CM | POA: Diagnosis not present

## 2020-10-29 DIAGNOSIS — M19011 Primary osteoarthritis, right shoulder: Secondary | ICD-10-CM | POA: Diagnosis not present

## 2020-10-29 DIAGNOSIS — M546 Pain in thoracic spine: Secondary | ICD-10-CM | POA: Diagnosis not present

## 2020-11-10 DIAGNOSIS — E782 Mixed hyperlipidemia: Secondary | ICD-10-CM | POA: Diagnosis not present

## 2020-11-10 DIAGNOSIS — M47816 Spondylosis without myelopathy or radiculopathy, lumbar region: Secondary | ICD-10-CM | POA: Diagnosis not present

## 2020-11-10 DIAGNOSIS — E119 Type 2 diabetes mellitus without complications: Secondary | ICD-10-CM | POA: Diagnosis not present

## 2020-11-10 DIAGNOSIS — Z6834 Body mass index (BMI) 34.0-34.9, adult: Secondary | ICD-10-CM | POA: Diagnosis not present

## 2020-11-10 DIAGNOSIS — Z9181 History of falling: Secondary | ICD-10-CM | POA: Diagnosis not present

## 2020-11-10 DIAGNOSIS — Z79899 Other long term (current) drug therapy: Secondary | ICD-10-CM | POA: Diagnosis not present

## 2020-11-10 DIAGNOSIS — M3506 Sjogren syndrome with peripheral nervous system involvement: Secondary | ICD-10-CM | POA: Diagnosis not present

## 2020-11-10 DIAGNOSIS — M62838 Other muscle spasm: Secondary | ICD-10-CM | POA: Diagnosis not present

## 2020-11-10 DIAGNOSIS — G63 Polyneuropathy in diseases classified elsewhere: Secondary | ICD-10-CM | POA: Diagnosis not present

## 2020-12-09 DIAGNOSIS — D649 Anemia, unspecified: Secondary | ICD-10-CM | POA: Diagnosis not present

## 2020-12-09 DIAGNOSIS — M549 Dorsalgia, unspecified: Secondary | ICD-10-CM | POA: Diagnosis not present

## 2020-12-09 DIAGNOSIS — M1990 Unspecified osteoarthritis, unspecified site: Secondary | ICD-10-CM | POA: Diagnosis not present

## 2020-12-09 DIAGNOSIS — M3500 Sicca syndrome, unspecified: Secondary | ICD-10-CM | POA: Diagnosis not present

## 2020-12-09 DIAGNOSIS — M4810 Ankylosing hyperostosis [Forestier], site unspecified: Secondary | ICD-10-CM | POA: Diagnosis not present

## 2020-12-09 DIAGNOSIS — Z79899 Other long term (current) drug therapy: Secondary | ICD-10-CM | POA: Diagnosis not present

## 2020-12-09 DIAGNOSIS — M797 Fibromyalgia: Secondary | ICD-10-CM | POA: Diagnosis not present

## 2020-12-09 DIAGNOSIS — N289 Disorder of kidney and ureter, unspecified: Secondary | ICD-10-CM | POA: Diagnosis not present

## 2020-12-09 DIAGNOSIS — M25511 Pain in right shoulder: Secondary | ICD-10-CM | POA: Diagnosis not present

## 2020-12-09 DIAGNOSIS — M0579 Rheumatoid arthritis with rheumatoid factor of multiple sites without organ or systems involvement: Secondary | ICD-10-CM | POA: Diagnosis not present

## 2020-12-10 DIAGNOSIS — Z79899 Other long term (current) drug therapy: Secondary | ICD-10-CM | POA: Diagnosis not present

## 2020-12-10 DIAGNOSIS — Z9181 History of falling: Secondary | ICD-10-CM | POA: Diagnosis not present

## 2020-12-10 DIAGNOSIS — E119 Type 2 diabetes mellitus without complications: Secondary | ICD-10-CM | POA: Diagnosis not present

## 2020-12-10 DIAGNOSIS — G63 Polyneuropathy in diseases classified elsewhere: Secondary | ICD-10-CM | POA: Diagnosis not present

## 2020-12-10 DIAGNOSIS — M62838 Other muscle spasm: Secondary | ICD-10-CM | POA: Diagnosis not present

## 2020-12-10 DIAGNOSIS — M3506 Sjogren syndrome with peripheral nervous system involvement: Secondary | ICD-10-CM | POA: Diagnosis not present

## 2020-12-10 DIAGNOSIS — M47816 Spondylosis without myelopathy or radiculopathy, lumbar region: Secondary | ICD-10-CM | POA: Diagnosis not present

## 2020-12-10 DIAGNOSIS — E782 Mixed hyperlipidemia: Secondary | ICD-10-CM | POA: Diagnosis not present

## 2020-12-10 DIAGNOSIS — Z6834 Body mass index (BMI) 34.0-34.9, adult: Secondary | ICD-10-CM | POA: Diagnosis not present

## 2020-12-17 DIAGNOSIS — M3500 Sicca syndrome, unspecified: Secondary | ICD-10-CM | POA: Diagnosis not present

## 2020-12-17 DIAGNOSIS — G4733 Obstructive sleep apnea (adult) (pediatric): Secondary | ICD-10-CM | POA: Diagnosis not present

## 2020-12-17 DIAGNOSIS — Z9989 Dependence on other enabling machines and devices: Secondary | ICD-10-CM | POA: Diagnosis not present

## 2020-12-17 DIAGNOSIS — K219 Gastro-esophageal reflux disease without esophagitis: Secondary | ICD-10-CM | POA: Diagnosis not present

## 2021-01-14 DIAGNOSIS — E782 Mixed hyperlipidemia: Secondary | ICD-10-CM | POA: Diagnosis not present

## 2021-01-14 DIAGNOSIS — M47816 Spondylosis without myelopathy or radiculopathy, lumbar region: Secondary | ICD-10-CM | POA: Diagnosis not present

## 2021-01-14 DIAGNOSIS — Z6834 Body mass index (BMI) 34.0-34.9, adult: Secondary | ICD-10-CM | POA: Diagnosis not present

## 2021-01-14 DIAGNOSIS — M3506 Sjogren syndrome with peripheral nervous system involvement: Secondary | ICD-10-CM | POA: Diagnosis not present

## 2021-01-14 DIAGNOSIS — Z0000 Encounter for general adult medical examination without abnormal findings: Secondary | ICD-10-CM | POA: Diagnosis not present

## 2021-01-14 DIAGNOSIS — E119 Type 2 diabetes mellitus without complications: Secondary | ICD-10-CM | POA: Diagnosis not present

## 2021-01-14 DIAGNOSIS — Z9181 History of falling: Secondary | ICD-10-CM | POA: Diagnosis not present

## 2021-01-14 DIAGNOSIS — Z79899 Other long term (current) drug therapy: Secondary | ICD-10-CM | POA: Diagnosis not present

## 2021-01-14 DIAGNOSIS — G63 Polyneuropathy in diseases classified elsewhere: Secondary | ICD-10-CM | POA: Diagnosis not present

## 2021-01-14 DIAGNOSIS — M62838 Other muscle spasm: Secondary | ICD-10-CM | POA: Diagnosis not present

## 2021-01-19 DIAGNOSIS — Z79899 Other long term (current) drug therapy: Secondary | ICD-10-CM | POA: Diagnosis not present

## 2021-01-20 ENCOUNTER — Other Ambulatory Visit: Payer: Self-pay

## 2021-01-20 ENCOUNTER — Inpatient Hospital Stay: Payer: PPO

## 2021-01-20 ENCOUNTER — Inpatient Hospital Stay: Payer: PPO | Attending: Physician Assistant | Admitting: Internal Medicine

## 2021-01-20 VITALS — BP 138/62 | HR 87 | Temp 97.2°F | Resp 18 | Ht 63.0 in | Wt 196.1 lb

## 2021-01-20 DIAGNOSIS — R5383 Other fatigue: Secondary | ICD-10-CM | POA: Insufficient documentation

## 2021-01-20 DIAGNOSIS — D508 Other iron deficiency anemias: Secondary | ICD-10-CM

## 2021-01-20 DIAGNOSIS — Z79899 Other long term (current) drug therapy: Secondary | ICD-10-CM | POA: Insufficient documentation

## 2021-01-20 DIAGNOSIS — D649 Anemia, unspecified: Secondary | ICD-10-CM | POA: Insufficient documentation

## 2021-01-20 DIAGNOSIS — D539 Nutritional anemia, unspecified: Secondary | ICD-10-CM | POA: Diagnosis not present

## 2021-01-20 DIAGNOSIS — E538 Deficiency of other specified B group vitamins: Secondary | ICD-10-CM | POA: Diagnosis not present

## 2021-01-20 LAB — CBC WITH DIFFERENTIAL (CANCER CENTER ONLY)
Abs Immature Granulocytes: 0 10*3/uL (ref 0.00–0.07)
Basophils Absolute: 0.1 10*3/uL (ref 0.0–0.1)
Basophils Relative: 1 %
Eosinophils Absolute: 0.2 10*3/uL (ref 0.0–0.5)
Eosinophils Relative: 4 %
HCT: 31.9 % — ABNORMAL LOW (ref 36.0–46.0)
Hemoglobin: 10.3 g/dL — ABNORMAL LOW (ref 12.0–15.0)
Immature Granulocytes: 0 %
Lymphocytes Relative: 34 %
Lymphs Abs: 1.6 10*3/uL (ref 0.7–4.0)
MCH: 27.9 pg (ref 26.0–34.0)
MCHC: 32.3 g/dL (ref 30.0–36.0)
MCV: 86.4 fL (ref 80.0–100.0)
Monocytes Absolute: 0.6 10*3/uL (ref 0.1–1.0)
Monocytes Relative: 12 %
Neutro Abs: 2.3 10*3/uL (ref 1.7–7.7)
Neutrophils Relative %: 49 %
Platelet Count: 263 10*3/uL (ref 150–400)
RBC: 3.69 MIL/uL — ABNORMAL LOW (ref 3.87–5.11)
RDW: 15.9 % — ABNORMAL HIGH (ref 11.5–15.5)
WBC Count: 4.8 10*3/uL (ref 4.0–10.5)
nRBC: 0 % (ref 0.0–0.2)

## 2021-01-20 LAB — VITAMIN B12: Vitamin B-12: 176 pg/mL — ABNORMAL LOW (ref 180–914)

## 2021-01-20 NOTE — Progress Notes (Signed)
[facility] [phone]   [phone]  OFFICE PROGRESS NOTE  Carylon Perches, NP [place] [address]  DIAGNOSIS: Normocytic anemia with a combination of iron deficiency as well as vitamin B12 deficiency.  PRIOR THERAPY: She is status post treatment with Venofer 200 Mg IV weekly for 4 doses in addition to vitamin B12 injections.  CURRENT THERAPY: Ferrous sulfate 325 mg p.o. daily.  INTERVAL HISTORY: Meredith Cline 70 y.o. female returns to the clinic today for 25-monthfollow-up visit.  The patient has no complaints today except for mild fatigue but no worsening in her condition over the last few months.  She takes over-the-counter oral iron tablet once daily.  She does not take any vitamin B12 supplement at this point.  She denied having any current chest pain, shortness of breath, cough or hemoptysis.  She denied having any fever or chills.  She has no nausea, vomiting, diarrhea or constipation.  She has no headache or visual changes.  She is here today for evaluation with repeat CBC, iron study and ferritin as well as vitamin B12 level.  MEDICAL HISTORY: Past Medical History:  Diagnosis Date   Diabetes mellitus without complication ([facility]    Hypercholesterolemia    Hypertension    Sleep apnea     ALLERGIES:  is allergic to no known allergies, clindamycin, clindamycin/lincomycin, and penicillins.  MEDICATIONS:  Current Outpatient Medications  Medication Sig Dispense Refill   amLODipine (NORVASC) 5 MG tablet 1 tablet     aspirin EC 81 MG tablet Take 81 mg by mouth daily.     baclofen (LIORESAL) 10 MG tablet 1 tablet with food or milk     benzonatate (TESSALON) 100 MG capsule benzonatate 100 mg capsule     celecoxib (CELEBREX) 200 MG capsule Take 200 mg by mouth 2 (two) times daily.     chlorhexidine (PERIDEX) 0.12 % solution Use as directed 15 mLs in the mouth or throat 2 (two) times daily. 120 mL 0   Cinnamon 500 MG  capsule Take by mouth.     ferrous sulfate 325 (65 FE) MG tablet 1 tablet     Fluticasone Furoate 50 MCG/ACT AEPB 2 puffs     HYDROcodone-acetaminophen (NORCO) 5-325 MG tablet Take 1 tablet by mouth every 6 (six) hours as needed for moderate pain. 12 tablet 0   ibuprofen (ADVIL,MOTRIN) 800 MG tablet Take 800 mg by mouth every 8 (eight) hours as needed.     insulin glargine (LANTUS) 100 UNIT/ML injection Inject 22 Units into the skin at bedtime. (Patient not taking: Reported on 08/11/2020)     Iron-Vit C-Vit B12-Folic Acid (IRON 1123XX123PLUS PO) Take by mouth.     ketotifen (ZADITOR) 0.025 % ophthalmic solution 1 drop into affected eye     leflunomide (ARAVA) 20 MG tablet 1 tablet     magnesium 30 MG tablet Take 250 mg by mouth 2 (two) times daily. (Patient not taking: Reported on 08/11/2020)     Melatonin 10 MG TBCR See admin instructions.     metFORMIN (GLUCOPHAGE) 500 MG tablet Take by mouth 2 (two) times daily with a meal.     metoprolol (LOPRESSOR) 50 MG tablet Take 50 mg by mouth 2 (two) times daily.     Multiple Vitamin (MULTI-VITAMIN) tablet Take by mouth.     omega-3 acid ethyl esters (LOVAZA) 1 g capsule Take by mouth 2 (two) times daily.     omeprazole (PRILOSEC) 20 MG  capsule Take 20 mg by mouth daily.     potassium chloride (K-DUR) 10 MEQ tablet Take 10 mEq by mouth daily. (Patient not taking: Reported on 08/11/2020)     pregabalin (LYRICA) 75 MG capsule TAKE 1 CAPSULE BY MOUTH TWICE DAILY AS NEEDED     simvastatin (ZOCOR) 40 MG tablet Take 40 mg by mouth daily.     terazosin (HYTRIN) 5 MG capsule Take 5 mg by mouth at bedtime.     traMADol (ULTRAM) 50 MG tablet 1 tablet as needed     traZODone (DESYREL) 50 MG tablet 1 tablet at bedtime as needed     triamterene-hydrochlorothiazide (MAXZIDE-25) 37.5-25 MG tablet Take 1 tablet by mouth daily.     No current facility-administered medications for this visit.    SURGICAL HISTORY:  Past Surgical History:  Procedure Laterality Date    ABDOMINAL HYSTERECTOMY     BREAST REDUCTION SURGERY     CARPAL TUNNEL RELEASE     HERNIA REPAIR     REDUCTION MAMMAPLASTY     app. 1980    REVIEW OF SYSTEMS:  A comprehensive review of systems was negative except for: Constitutional: positive for fatigue   PHYSICAL EXAMINATION: General appearance: alert, cooperative, fatigued, and no distress Head: Normocephalic, without obvious abnormality, atraumatic Neck: no adenopathy, no JVD, supple, symmetrical, trachea midline, and thyroid not enlarged, symmetric, no tenderness/mass/nodules Lymph nodes: Cervical, supraclavicular, and axillary nodes normal. Resp: clear to auscultation bilaterally Back: symmetric, no curvature. ROM normal. No CVA tenderness. Cardio: regular rate and rhythm, S1, S2 normal, no murmur, click, rub or gallop GI: soft, non-tender; bowel sounds normal; no masses,  no organomegaly Extremities: extremities normal, atraumatic, no cyanosis or edema  ECOG PERFORMANCE STATUS: 1 - Symptomatic but completely ambulatory  Blood pressure 138/62, pulse 87, temperature (!) 97.2 F (36.2 C), temperature source Tympanic, resp. rate 18, height '5\' 3"'$  (1.6 m), weight 196 lb 1.6 oz (89 kg), SpO2 95 %.  LABORATORY DATA: Lab Results  Component Value Date   WBC 4.8 01/20/2021   HGB 10.3 (L) 01/20/2021   HCT 31.9 (L) 01/20/2021   MCV 86.4 01/20/2021   PLT 263 01/20/2021      Chemistry      Component Value Date/Time   NA 143 10/20/2020 1327   K 4.3 10/20/2020 1327   CL 105 10/20/2020 1327   CO2 28 10/20/2020 1327   BUN 21 10/20/2020 1327   CREATININE 1.23 (H) 10/20/2020 1327      Component Value Date/Time   CALCIUM 9.9 10/20/2020 1327   ALKPHOS 24 (L) 10/20/2020 1327   AST 19 10/20/2020 1327   ALT 12 10/20/2020 1327   BILITOT 0.3 10/20/2020 1327       RADIOGRAPHIC STUDIES: No results found.  ASSESSMENT AND PLAN: This is a very pleasant 70 years old African-American female with normocytic anemia likely secondary to  mixture of iron deficiency as well as vitamin B12 deficiency.  The patient was treated with iron infusion with Venofer in addition to weekly vitamin B12 injection. The patient is feeling fine today with no concerning complaints except for mild fatigue. Repeat CBC showed persistent but stable anemia with hemoglobin of 10.3.  Iron study, ferritin and vitamin B12 level are still pending. I recommended for the patient to continue with the oral iron tablet for now.  I also advised her to take vitamin B12 supplement. I will see her back for follow-up visit in 3 months for evaluation and repeat blood work but I may consider  her for iron infusion in the interval if she has any significant iron deficiency on the pending blood work. The patient was advised to call immediately if she has any other concerning symptoms in the interval. The patient voices understanding of current disease status and treatment options and is in agreement with the current care plan.  All questions were answered. The patient knows to call the clinic with any problems, questions or concerns. We can certainly see the patient much sooner if necessary.  The total time spent in the appointment was 20 minutes.  Disclaimer: This note was dictated with voice recognition software. Similar sounding words can inadvertently be transcribed and may not be corrected upon review.

## 2021-01-21 DIAGNOSIS — M3500 Sicca syndrome, unspecified: Secondary | ICD-10-CM | POA: Diagnosis not present

## 2021-01-21 DIAGNOSIS — M549 Dorsalgia, unspecified: Secondary | ICD-10-CM | POA: Diagnosis not present

## 2021-01-21 DIAGNOSIS — M25511 Pain in right shoulder: Secondary | ICD-10-CM | POA: Diagnosis not present

## 2021-01-21 DIAGNOSIS — R700 Elevated erythrocyte sedimentation rate: Secondary | ICD-10-CM | POA: Diagnosis not present

## 2021-01-21 DIAGNOSIS — M797 Fibromyalgia: Secondary | ICD-10-CM | POA: Diagnosis not present

## 2021-01-21 DIAGNOSIS — N289 Disorder of kidney and ureter, unspecified: Secondary | ICD-10-CM | POA: Diagnosis not present

## 2021-01-21 DIAGNOSIS — Z79899 Other long term (current) drug therapy: Secondary | ICD-10-CM | POA: Diagnosis not present

## 2021-01-21 DIAGNOSIS — M1990 Unspecified osteoarthritis, unspecified site: Secondary | ICD-10-CM | POA: Diagnosis not present

## 2021-01-21 DIAGNOSIS — M0579 Rheumatoid arthritis with rheumatoid factor of multiple sites without organ or systems involvement: Secondary | ICD-10-CM | POA: Diagnosis not present

## 2021-01-21 DIAGNOSIS — M4810 Ankylosing hyperostosis [Forestier], site unspecified: Secondary | ICD-10-CM | POA: Diagnosis not present

## 2021-01-21 DIAGNOSIS — D649 Anemia, unspecified: Secondary | ICD-10-CM | POA: Diagnosis not present

## 2021-01-21 LAB — IRON AND TIBC
Iron: 49 ug/dL (ref 41–142)
Saturation Ratios: 22 % (ref 21–57)
TIBC: 220 ug/dL — ABNORMAL LOW (ref 236–444)
UIBC: 171 ug/dL (ref 120–384)

## 2021-01-21 LAB — FERRITIN: Ferritin: 608 ng/mL — ABNORMAL HIGH (ref 11–307)

## 2021-02-11 DIAGNOSIS — Z79899 Other long term (current) drug therapy: Secondary | ICD-10-CM | POA: Diagnosis not present

## 2021-02-11 DIAGNOSIS — Z6834 Body mass index (BMI) 34.0-34.9, adult: Secondary | ICD-10-CM | POA: Diagnosis not present

## 2021-02-11 DIAGNOSIS — G63 Polyneuropathy in diseases classified elsewhere: Secondary | ICD-10-CM | POA: Diagnosis not present

## 2021-02-11 DIAGNOSIS — E119 Type 2 diabetes mellitus without complications: Secondary | ICD-10-CM | POA: Diagnosis not present

## 2021-02-11 DIAGNOSIS — M47816 Spondylosis without myelopathy or radiculopathy, lumbar region: Secondary | ICD-10-CM | POA: Diagnosis not present

## 2021-02-11 DIAGNOSIS — M3506 Sjogren syndrome with peripheral nervous system involvement: Secondary | ICD-10-CM | POA: Diagnosis not present

## 2021-02-11 DIAGNOSIS — E782 Mixed hyperlipidemia: Secondary | ICD-10-CM | POA: Diagnosis not present

## 2021-02-11 DIAGNOSIS — M62838 Other muscle spasm: Secondary | ICD-10-CM | POA: Diagnosis not present

## 2021-02-11 DIAGNOSIS — Z9181 History of falling: Secondary | ICD-10-CM | POA: Diagnosis not present

## 2021-02-25 DIAGNOSIS — E119 Type 2 diabetes mellitus without complications: Secondary | ICD-10-CM | POA: Diagnosis not present

## 2021-03-11 DIAGNOSIS — G63 Polyneuropathy in diseases classified elsewhere: Secondary | ICD-10-CM | POA: Diagnosis not present

## 2021-03-11 DIAGNOSIS — Z6834 Body mass index (BMI) 34.0-34.9, adult: Secondary | ICD-10-CM | POA: Diagnosis not present

## 2021-03-11 DIAGNOSIS — E119 Type 2 diabetes mellitus without complications: Secondary | ICD-10-CM | POA: Diagnosis not present

## 2021-03-11 DIAGNOSIS — E782 Mixed hyperlipidemia: Secondary | ICD-10-CM | POA: Diagnosis not present

## 2021-03-11 DIAGNOSIS — Z9181 History of falling: Secondary | ICD-10-CM | POA: Diagnosis not present

## 2021-03-11 DIAGNOSIS — M47816 Spondylosis without myelopathy or radiculopathy, lumbar region: Secondary | ICD-10-CM | POA: Diagnosis not present

## 2021-03-11 DIAGNOSIS — Z79899 Other long term (current) drug therapy: Secondary | ICD-10-CM | POA: Diagnosis not present

## 2021-03-11 DIAGNOSIS — M3506 Sjogren syndrome with peripheral nervous system involvement: Secondary | ICD-10-CM | POA: Diagnosis not present

## 2021-03-11 DIAGNOSIS — M62838 Other muscle spasm: Secondary | ICD-10-CM | POA: Diagnosis not present

## 2021-03-18 DIAGNOSIS — M549 Dorsalgia, unspecified: Secondary | ICD-10-CM | POA: Diagnosis not present

## 2021-03-18 DIAGNOSIS — D649 Anemia, unspecified: Secondary | ICD-10-CM | POA: Diagnosis not present

## 2021-03-18 DIAGNOSIS — N289 Disorder of kidney and ureter, unspecified: Secondary | ICD-10-CM | POA: Diagnosis not present

## 2021-03-18 DIAGNOSIS — M25511 Pain in right shoulder: Secondary | ICD-10-CM | POA: Diagnosis not present

## 2021-03-18 DIAGNOSIS — M0579 Rheumatoid arthritis with rheumatoid factor of multiple sites without organ or systems involvement: Secondary | ICD-10-CM | POA: Diagnosis not present

## 2021-03-18 DIAGNOSIS — M3500 Sicca syndrome, unspecified: Secondary | ICD-10-CM | POA: Diagnosis not present

## 2021-03-18 DIAGNOSIS — M797 Fibromyalgia: Secondary | ICD-10-CM | POA: Diagnosis not present

## 2021-03-18 DIAGNOSIS — M4810 Ankylosing hyperostosis [Forestier], site unspecified: Secondary | ICD-10-CM | POA: Diagnosis not present

## 2021-03-18 DIAGNOSIS — Z79899 Other long term (current) drug therapy: Secondary | ICD-10-CM | POA: Diagnosis not present

## 2021-03-18 DIAGNOSIS — M1990 Unspecified osteoarthritis, unspecified site: Secondary | ICD-10-CM | POA: Diagnosis not present

## 2021-03-18 DIAGNOSIS — R700 Elevated erythrocyte sedimentation rate: Secondary | ICD-10-CM | POA: Diagnosis not present

## 2021-03-25 DIAGNOSIS — Z9989 Dependence on other enabling machines and devices: Secondary | ICD-10-CM | POA: Diagnosis not present

## 2021-03-25 DIAGNOSIS — I10 Essential (primary) hypertension: Secondary | ICD-10-CM | POA: Diagnosis not present

## 2021-03-25 DIAGNOSIS — M0579 Rheumatoid arthritis with rheumatoid factor of multiple sites without organ or systems involvement: Secondary | ICD-10-CM | POA: Diagnosis not present

## 2021-03-25 DIAGNOSIS — G4733 Obstructive sleep apnea (adult) (pediatric): Secondary | ICD-10-CM | POA: Diagnosis not present

## 2021-04-03 IMAGING — MG DIGITAL SCREENING BILAT W/ CAD
4 series · 4 of 4 positions shown · non-contrast
Comparison: Previous exam(s).

CLINICAL DATA: Screening.

EXAM:
DIGITAL SCREENING BILATERAL MAMMOGRAM WITH CAD

[R CC]
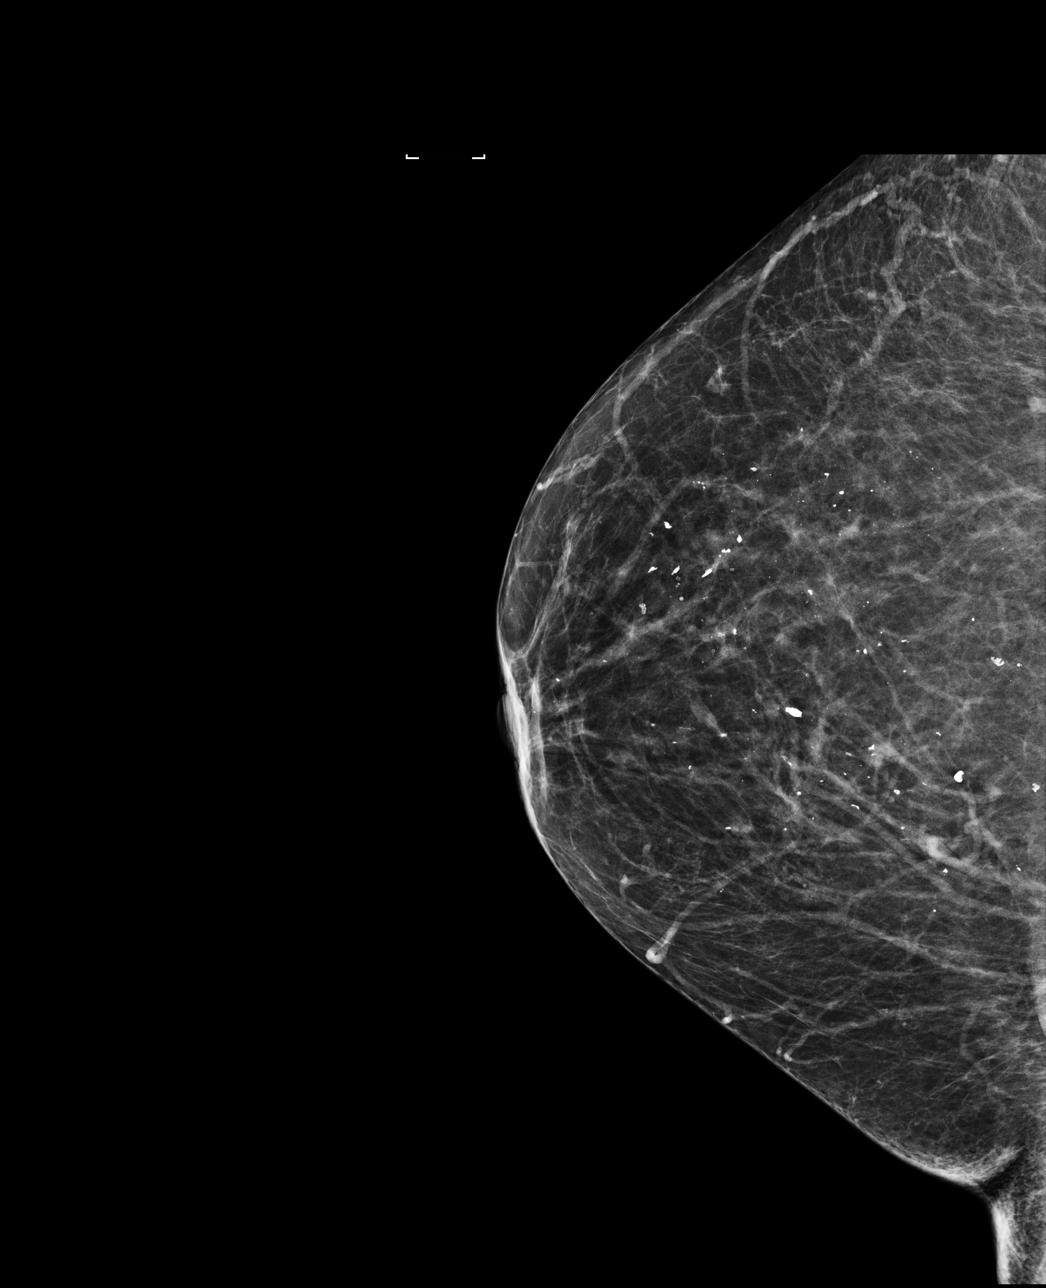

[L MLO]
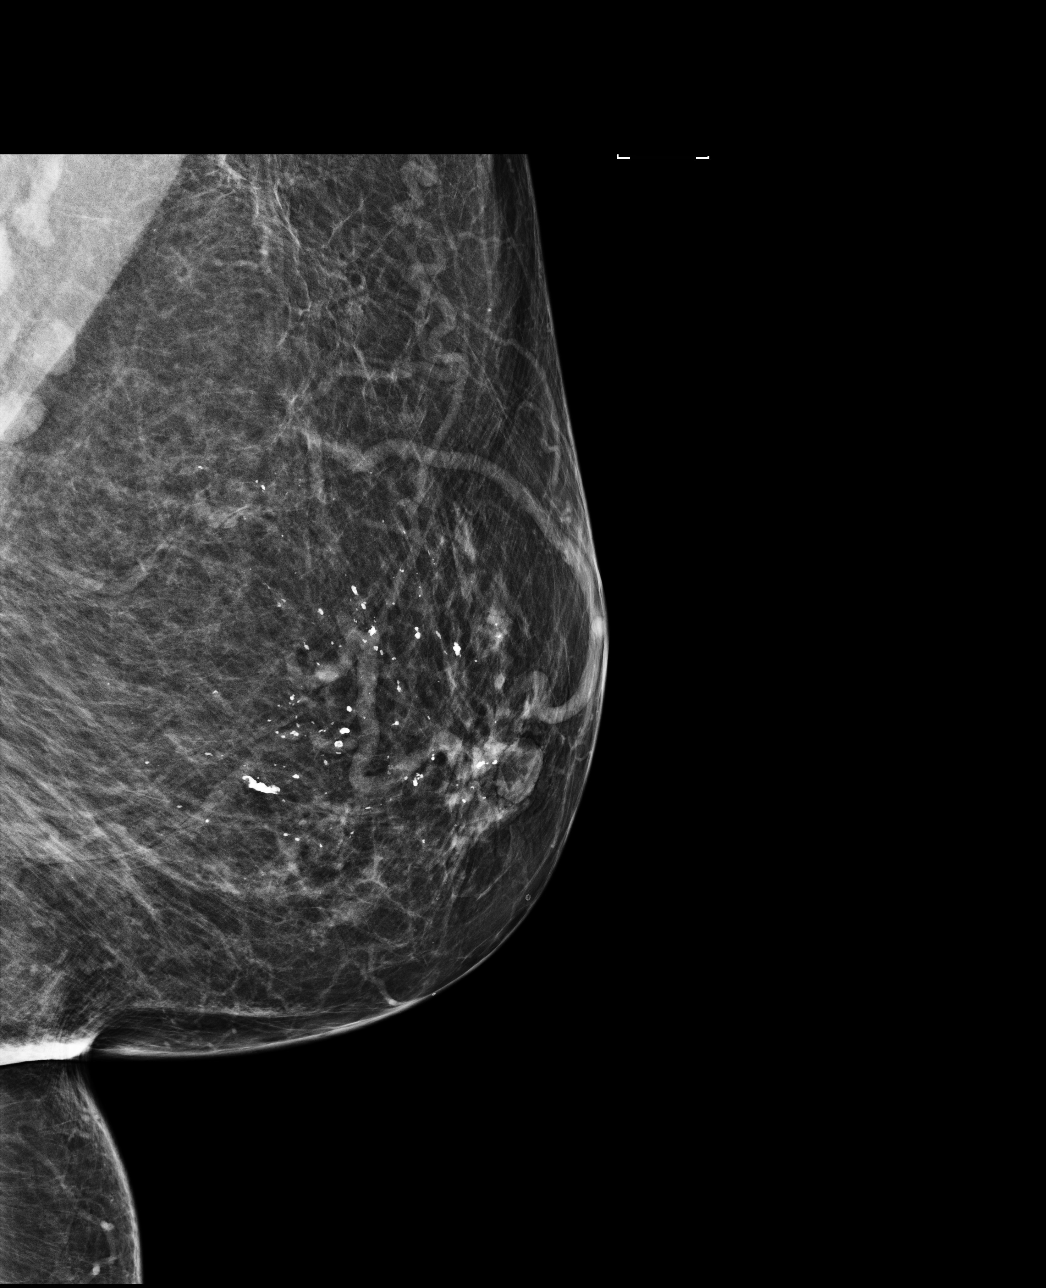

[R MLO]
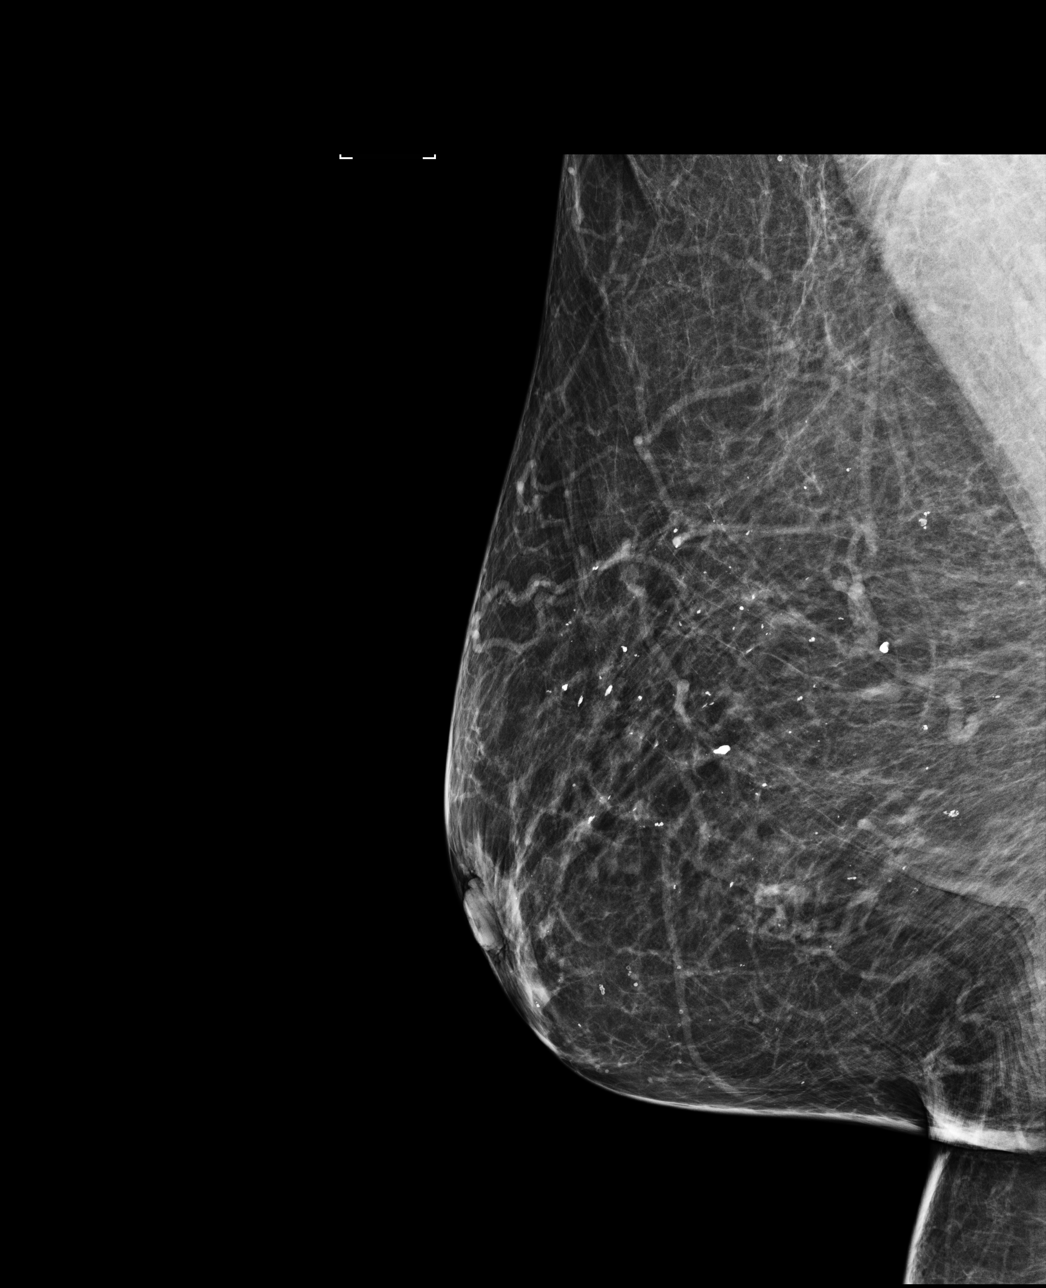

[L CC]
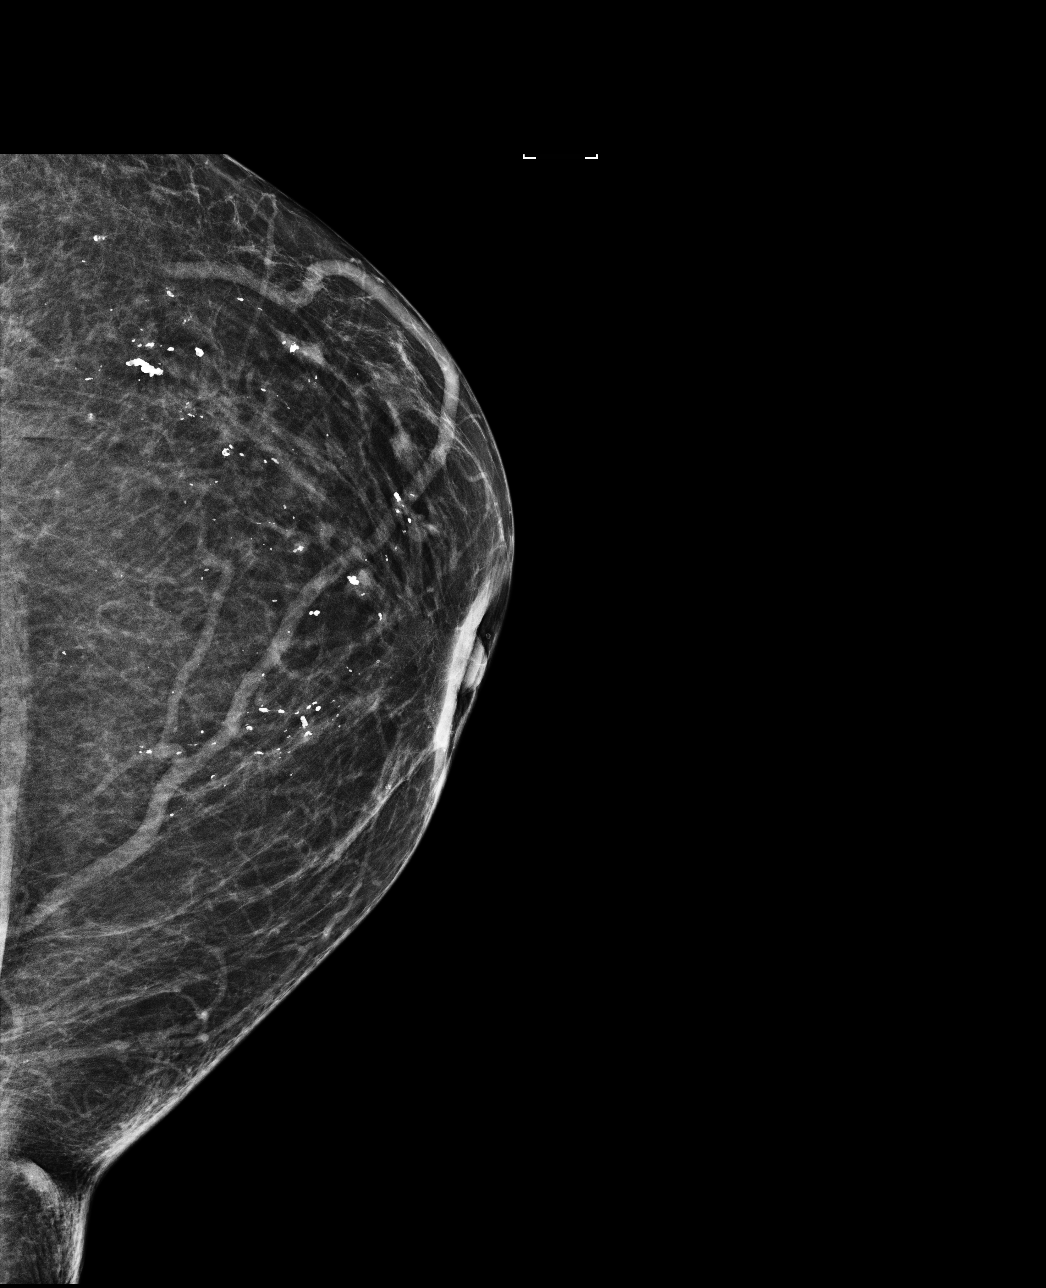

[4 of 4 positions shown; findings below may reference images not displayed]

ACR Breast Density Category b: There are scattered areas of
fibroglandular density.
FINDINGS: There are no findings suspicious for malignancy. Images were
processed with CAD.
IMPRESSION: No mammographic evidence of malignancy. A result letter of this
screening mammogram will be mailed directly to the patient.

RECOMMENDATION:
Screening mammogram in one year. (Code:AS-G-LCT)

BI-RADS CATEGORY  1: Negative.

## 2021-04-08 DIAGNOSIS — E119 Type 2 diabetes mellitus without complications: Secondary | ICD-10-CM | POA: Diagnosis not present

## 2021-04-08 DIAGNOSIS — Z9181 History of falling: Secondary | ICD-10-CM | POA: Diagnosis not present

## 2021-04-08 DIAGNOSIS — E782 Mixed hyperlipidemia: Secondary | ICD-10-CM | POA: Diagnosis not present

## 2021-04-08 DIAGNOSIS — M3506 Sjogren syndrome with peripheral nervous system involvement: Secondary | ICD-10-CM | POA: Diagnosis not present

## 2021-04-08 DIAGNOSIS — Z79899 Other long term (current) drug therapy: Secondary | ICD-10-CM | POA: Diagnosis not present

## 2021-04-08 DIAGNOSIS — Z6834 Body mass index (BMI) 34.0-34.9, adult: Secondary | ICD-10-CM | POA: Diagnosis not present

## 2021-04-08 DIAGNOSIS — G63 Polyneuropathy in diseases classified elsewhere: Secondary | ICD-10-CM | POA: Diagnosis not present

## 2021-04-08 DIAGNOSIS — M47816 Spondylosis without myelopathy or radiculopathy, lumbar region: Secondary | ICD-10-CM | POA: Diagnosis not present

## 2021-04-08 DIAGNOSIS — M62838 Other muscle spasm: Secondary | ICD-10-CM | POA: Diagnosis not present

## 2021-04-26 ENCOUNTER — Inpatient Hospital Stay: Payer: PPO | Attending: Internal Medicine | Admitting: Internal Medicine

## 2021-04-26 ENCOUNTER — Inpatient Hospital Stay: Payer: PPO

## 2021-04-29 DIAGNOSIS — H16143 Punctate keratitis, bilateral: Secondary | ICD-10-CM | POA: Diagnosis not present

## 2021-04-29 DIAGNOSIS — H04123 Dry eye syndrome of bilateral lacrimal glands: Secondary | ICD-10-CM | POA: Diagnosis not present

## 2021-05-12 DIAGNOSIS — G63 Polyneuropathy in diseases classified elsewhere: Secondary | ICD-10-CM | POA: Diagnosis not present

## 2021-05-12 DIAGNOSIS — M62838 Other muscle spasm: Secondary | ICD-10-CM | POA: Diagnosis not present

## 2021-05-12 DIAGNOSIS — M47816 Spondylosis without myelopathy or radiculopathy, lumbar region: Secondary | ICD-10-CM | POA: Diagnosis not present

## 2021-05-12 DIAGNOSIS — E119 Type 2 diabetes mellitus without complications: Secondary | ICD-10-CM | POA: Diagnosis not present

## 2021-05-12 DIAGNOSIS — M3506 Sjogren syndrome with peripheral nervous system involvement: Secondary | ICD-10-CM | POA: Diagnosis not present

## 2021-05-12 DIAGNOSIS — Z9181 History of falling: Secondary | ICD-10-CM | POA: Diagnosis not present

## 2021-05-12 DIAGNOSIS — Z79899 Other long term (current) drug therapy: Secondary | ICD-10-CM | POA: Diagnosis not present

## 2021-05-12 DIAGNOSIS — E782 Mixed hyperlipidemia: Secondary | ICD-10-CM | POA: Diagnosis not present

## 2021-05-12 DIAGNOSIS — Z6834 Body mass index (BMI) 34.0-34.9, adult: Secondary | ICD-10-CM | POA: Diagnosis not present

## 2021-05-28 ENCOUNTER — Other Ambulatory Visit: Payer: Self-pay | Admitting: Family Medicine

## 2021-05-28 DIAGNOSIS — Z1231 Encounter for screening mammogram for malignant neoplasm of breast: Secondary | ICD-10-CM

## 2021-06-24 ENCOUNTER — Ambulatory Visit
Admission: RE | Admit: 2021-06-24 | Discharge: 2021-06-24 | Disposition: A | Discharge status: Home / Self Care | Payer: PPO | Source: Ambulatory Visit | Attending: Family Medicine | Admitting: Family Medicine

## 2021-06-24 DIAGNOSIS — Z1231 Encounter for screening mammogram for malignant neoplasm of breast: Secondary | ICD-10-CM

## 2021-08-12 ENCOUNTER — Encounter: Payer: Self-pay | Admitting: Endocrinology

## 2021-08-12 ENCOUNTER — Ambulatory Visit (INDEPENDENT_AMBULATORY_CARE_PROVIDER_SITE_OTHER): Payer: PPO | Admitting: Endocrinology

## 2021-08-12 ENCOUNTER — Other Ambulatory Visit: Payer: Self-pay

## 2021-08-12 DIAGNOSIS — R61 Generalized hyperhidrosis: Secondary | ICD-10-CM

## 2021-08-12 NOTE — Patient Instructions (Addendum)
Blood tests, and a 24HR urine collection, are requested for you today.  We'll let you know about the results.   ? ?

## 2021-08-12 NOTE — Progress Notes (Signed)
? ?Subjective:  ? ? Patient ID: Meredith Cline, female    DOB: Oct 10, 1950, 71 y.o.   MRN: [id number] ? ?HPI ?Pt is referred by Carylon Perches, NP, for sxs.  she reports 1 year of excessive sweating.  She gets sxs daily, but is unable to cite precip factor.  she has h/o HTN.  she has no h/o adrenal disease, migraine headache, MTC, alcohol withdrawal, neurofibromas, hemangiomas, brain aneurysm, drug use, or thyroid disease.  she has never had adrenal imaging.  She also has cold intolerance and generalized body pain.  She no longer takes insulin.  She denies hypoglycemia.   ?Past Medical History:  ?Diagnosis Date  ? Diabetes mellitus without complication ([facility])   ? Hypercholesterolemia   ? Hypertension   ? Sleep apnea   ? ? ?Past Surgical History:  ?Procedure Laterality Date  ? ABDOMINAL HYSTERECTOMY    ? BREAST REDUCTION SURGERY    ? CARPAL TUNNEL RELEASE    ? HERNIA REPAIR    ? REDUCTION MAMMAPLASTY    ? app. 1980  ? ? ?Social History  ? ?Socioeconomic History  ? Marital status: Widowed  ?  Spouse name: Not on file  ? Number of children: Not on file  ? Years of education: Not on file  ? Highest education level: Not on file  ?Occupational History  ? Not on file  ?Tobacco Use  ? Smoking status: Never  ? Smokeless tobacco: Never  ?Vaping Use  ? Vaping Use: Never used  ?Substance and Sexual Activity  ? Alcohol use: No  ? Drug use: Never  ? Sexual activity: Not on file  ?Other Topics Concern  ? Not on file  ?Social History Narrative  ? Not on file  ? ?Social Determinants of Health  ? ?Financial Resource Strain: Not on file  ?Food Insecurity: Not on file  ?Transportation Needs: Not on file  ?Physical Activity: Not on file  ?Stress: Not on file  ?Social Connections: Not on file  ?Intimate Partner Violence: Not on file  ? ? ?Current Outpatient Medications on File Prior to Visit  ?Medication Sig Dispense Refill  ? amLODipine (NORVASC) 5 MG tablet 1 tablet    ? aspirin EC 81 MG tablet Take 81 mg by mouth daily.    ? baclofen  (LIORESAL) 10 MG tablet 1 tablet with food or milk    ? Cinnamon 500 MG capsule Take by mouth.    ? ferrous sulfate 325 (65 FE) MG tablet 1 tablet    ? Fluticasone Furoate 50 MCG/ACT AEPB 2 puffs    ? HYDROcodone-acetaminophen (NORCO) 5-325 MG tablet Take 1 tablet by mouth every 6 (six) hours as needed for moderate pain. 12 tablet 0  ? Iron-Vit C-Vit B12-Folic Acid (IRON 544 PLUS PO) Take by mouth.    ? leflunomide (ARAVA) 20 MG tablet 1 tablet    ? metFORMIN (GLUCOPHAGE) 500 MG tablet Take by mouth 2 (two) times daily with a meal.    ? metoprolol (LOPRESSOR) 50 MG tablet Take 50 mg by mouth 2 (two) times daily.    ? Multiple Vitamin (MULTI-VITAMIN) tablet Take by mouth.    ? omega-3 acid ethyl esters (LOVAZA) 1 g capsule Take by mouth 2 (two) times daily.    ? omeprazole (PRILOSEC) 20 MG capsule Take 20 mg by mouth daily.    ? potassium chloride (K-DUR) 10 MEQ tablet Take 10 mEq by mouth daily.    ? pregabalin (LYRICA) 75 MG capsule TAKE 1 CAPSULE BY MOUTH  TWICE DAILY AS NEEDED    ? simvastatin (ZOCOR) 40 MG tablet Take 40 mg by mouth daily.    ? terazosin (HYTRIN) 5 MG capsule Take 5 mg by mouth at bedtime.    ? traMADol (ULTRAM) 50 MG tablet 1 tablet as needed    ? traZODone (DESYREL) 50 MG tablet 1 tablet at bedtime as needed    ? triamterene-hydrochlorothiazide (MAXZIDE-25) 37.5-25 MG tablet Take 1 tablet by mouth daily.    ? benzonatate (TESSALON) 100 MG capsule benzonatate 100 mg capsule (Patient not taking: Reported on 08/12/2021)    ? celecoxib (CELEBREX) 200 MG capsule Take 200 mg by mouth 2 (two) times daily. (Patient not taking: Reported on 08/12/2021)    ? chlorhexidine (PERIDEX) 0.12 % solution Use as directed 15 mLs in the mouth or throat 2 (two) times daily. (Patient not taking: Reported on 08/12/2021) 120 mL 0  ? ibuprofen (ADVIL,MOTRIN) 800 MG tablet Take 800 mg by mouth every 8 (eight) hours as needed. (Patient not taking: Reported on 08/12/2021)    ? ketotifen (ZADITOR) 0.025 % ophthalmic solution 1  drop into affected eye (Patient not taking: Reported on 08/12/2021)    ? magnesium 30 MG tablet Take 250 mg by mouth 2 (two) times daily. (Patient not taking: Reported on 08/11/2020)    ? Melatonin 10 MG TBCR See admin instructions. (Patient not taking: Reported on 08/12/2021)    ? ?No current facility-administered medications on file prior to visit.  ? ? ?Allergies  ?Allergen Reactions  ? Gabapentin Other (See Comments)  ? No Known Allergies   ? Clindamycin Nausea And Vomiting  ? Clindamycin/Lincomycin Nausea And Vomiting  ? Penicillins Rash  ? ? ?Family History  ?Problem Relation Age of Onset  ? Adrenal disorder Neg Hx   ? ? ?BP 138/76 (BP Location: Left Arm, Patient Position: Sitting, Cuff Size: Normal)   Pulse 92   Ht '5\' 3"'$  (1.6 m)   Wt 206 lb 6.4 oz (93.6 kg)   SpO2 96%   BMI 36.56 kg/m?  ? ? ?Review of Systems ?denies headache, flushing, n/v, syncope, diarrhea, weight loss, sob, anxiety, palpitations, and fever.   ?   ?Objective:  ? Physical Exam ?VS: see vs page ?GEN: no distress ?HEAD: head: no deformity ?eyes: no periorbital swelling, no proptosis ?external nose and ears are normal ?NECK: supple, thyroid is not enlarged ?CHEST WALL: no deformity ?LUNGS: clear to auscultation ?CV: reg rate and rhythm, no murmur.  ?MUSCULOSKELETAL: gait is normal and steady ?EXTEMITIES: no deformity.  1+ bilat leg edema ?NEURO:  readily moves all 4's.  sensation is intact to touch on all 4's.  No tremor ?SKIN:  Normal texture and temperature.  No rash or suspicious lesion is visible.  Not diaphoretic ?NODES:  None palpable at the neck ?PSYCH: alert, well-oriented.  Does not appear anxious nor depressed.  ? ? ?I have reviewed outside records, and summarized: ?Pt was noted to have elevated excessive sweating, and referred here.  Chronic pain and other sxs were addressed ? ?   ?Assessment & Plan:  ?Excessive sweating: uncertain etiology and prognosis ? ? ?Patient Instructions  ?Blood tests, and a 24HR urine collection, are  requested for you today.  We'll let you know about the results.   ? ? ? ?

## 2021-08-13 ENCOUNTER — Other Ambulatory Visit: Payer: PPO

## 2021-08-13 DIAGNOSIS — R61 Generalized hyperhidrosis: Secondary | ICD-10-CM

## 2021-08-13 NOTE — Progress Notes (Unsigned)
Total volume 1550.  Started 08-12-21 at 4:15 pm ended 08-13-2021 at 3:30 pm. ? ?

## 2021-08-18 LAB — 5 HIAA, QUANTITATIVE, URINE, 24 HOUR
5 HIAA, 24 Hour Urine: 4.7 mg/24 h (ref <=6.0)
Total Volume: 1550 mL

## 2021-08-19 LAB — CATECHOLAMINES, FRACTIONATED, URINE, 24 HOUR
Calc Total (E+NE): 31 mcg/24 h (ref 26–121)
Creatinine, Urine mg/day-CATEUR: 1.08 g/(24.h) (ref 0.50–2.15)
Dopamine 24 Hr Urine: 175 mcg/24 h (ref 52–480)
Norepinephrine, 24H, Ur: 31 mcg/24 h (ref 15–100)
Total Volume: 1550 mL

## 2021-08-19 LAB — METANEPHRINES, URINE, 24 HOUR
METANEPHRINE: 99 mcg/24 h (ref 90–315)
METANEPHRINES, TOTAL: 577 mcg/24 h (ref 224–832)
NORMETANEPHRINE: 478 mcg/24 h (ref 122–676)
Total Volume: 1550 mL

## 2021-09-01 LAB — METANEPHRINES, PLASMA
Metanephrine, Free: 29 pg/mL (ref <=57)
Normetanephrine, Free: 184 pg/mL — ABNORMAL HIGH (ref <=148)
Total Metanephrines-Plasma: 213 pg/mL — ABNORMAL HIGH (ref <=205)

## 2021-09-01 LAB — CATECHOLAMINES, FRACTIONATED, PLASMA
Dopamine: 21 pg/mL — ABNORMAL HIGH
Epinephrine: 69 pg/mL
Norepinephrine: 1105 pg/mL
Total Catecholamines: 1195 pg/mL — ABNORMAL HIGH

## 2021-09-01 LAB — CALCITONIN: Calcitonin: <2 pg/mL (ref <=5)

## 2022-03-07 ENCOUNTER — Other Ambulatory Visit: Payer: Self-pay | Admitting: Orthopedic Surgery

## 2022-03-08 ENCOUNTER — Encounter: Payer: Self-pay | Admitting: Physician Assistant

## 2022-03-10 NOTE — Patient Instructions (Addendum)
SURGICAL WAITING ROOM VISITATION Patients having surgery or a procedure may have no more than 2 support people in the waiting area - these visitors may rotate.   Children under the age of 39 must have an adult with them who is not the patient. If the patient needs to stay at the hospital during part of their recovery, the visitor guidelines for inpatient rooms apply. Pre-op nurse will coordinate an appropriate time for 1 support person to accompany patient in pre-op.  This support person may not rotate.    Please refer to the [facility] website for the visitor guidelines for Inpatients (after your surgery is over and you are in a regular room).      Your procedure is scheduled on: 03-21-22   Report to [place] Entrance    Report to admitting at 9:30 AM   Call this number if you have problems the morning of surgery [phone]   Do not eat food :After Midnight.   After Midnight you may have the following liquids until 9:00 AM DAY OF SURGERY  Water Non-Citrus Juices (without pulp, NO RED) Carbonated Beverages Black Coffee (NO MILK/CREAM OR CREAMERS, sugar ok)  Clear Tea (NO MILK/CREAM OR CREAMERS, sugar ok) regular and decaf                             Plain Jell-O (NO RED)                                           Fruit ices (not with fruit pulp, NO RED)                                     Popsicles (NO RED)                                                               Sports drinks like Gatorade (NO RED)                   The day of surgery:  Drink ONE (1) Pre-Surgery G2 at 9:00 AM the morning of surgery. Drink in one sitting. Do not sip.  This drink was given to you during your hospital  pre-op appointment visit. Nothing else to drink after completing the Pre-Surgery G2.          If you have questions, please contact your surgeon's office.   FOLLOW  ANY ADDITIONAL PRE OP INSTRUCTIONS YOU RECEIVED FROM YOUR SURGEON'S OFFICE!!!     Oral Hygiene is also  important to reduce your risk of infection.                                    Remember - BRUSH YOUR TEETH THE MORNING OF SURGERY WITH YOUR REGULAR TOOTHPASTE   Do NOT smoke after Midnight   Take these medicines the morning of surgery with A SIP OF WATER:   Carvedilol  Omeprazole  Pregabalin  Simvastatin  Hydrocodone if needed  Tylenol if needed  How to Manage Your Diabetes Before and After Surgery  Why is it important to control my blood sugar before and after surgery? Improving blood sugar levels before and after surgery helps healing and can limit problems. A way of improving blood sugar control is eating a healthy diet by:  Eating less sugar and carbohydrates  Increasing activity/exercise  Talking with your doctor about reaching your blood sugar goals High blood sugars (greater than 180 mg/dL) can raise your risk of infections and slow your recovery, so you will need to focus on controlling your diabetes during the weeks before surgery. Make sure that the doctor who takes care of your diabetes knows about your planned surgery including the date and location.  How do I manage my blood sugar before surgery? Check your blood sugar at least 4 times a day, starting 2 days before surgery, to make sure that the level is not too high or low. Check your blood sugar the morning of your surgery when you wake up and every 2 hours until you get to the Short Stay unit. If your blood sugar is less than 70 mg/dL, you will need to treat for low blood sugar: Do not take insulin. Treat a low blood sugar (less than 70 mg/dL) with  cup of clear juice (cranberry or apple), 4 glucose tablets, OR glucose gel. Recheck blood sugar in 15 minutes after treatment (to make sure it is greater than 70 mg/dL). If your blood sugar is not greater than 70 mg/dL on recheck, call [phone] for further instructions. Report your blood sugar to the short stay nurse when you get to Short Stay.  If you are admitted  to the hospital after surgery: Your blood sugar will be checked by the staff and you will probably be given insulin after surgery (instead of oral diabetes medicines) to make sure you have good blood sugar levels. The goal for blood sugar control after surgery is 80-180 mg/dL.   WHAT DO I DO ABOUT MY DIABETES MEDICATION?  Do not take oral diabetes medicines (pills) the morning of surgery (do not take Glipizide or Metformin)  DO NOT TAKE THE FOLLOWING 7 DAYS PRIOR TO SURGERY: Ozempic, Wegovy, Rybelsus (Semaglutide), Byetta (exenatide), Bydureon (exenatide ER), Victoza, Saxenda (liraglutide), or Trulicity (dulaglutide) Mounjaro (Tirzepatide) Adlyxin (Lixisenatide), Polyethylene Glycol Loxenatide.  Reviewed and Endorsed by [facility] Patient Education Committee, August 2015   Bring CPAP mask and tubing day of surgery.                              You may not have any metal on your body including hair pins, jewelry, and body piercing             Do not wear make-up, lotions, powders, perfumes/ or deodorant  Do not wear nail polish including gel and S&S, artificial/acrylic nails, or any other type of covering on natural nails including finger and toenails. If you have artificial nails, gel coating, etc. that needs to be removed by a nail salon please have this removed prior to surgery or surgery may need to be canceled/ delayed if the surgeon/ anesthesia feels like they are unable to be safely monitored.   Do not shave  48 hours prior to surgery.        Do not bring valuables to the hospital. [place].   Contacts, dentures or bridgework may not be  worn into surgery.  DO NOT [place]. PHARMACY WILL DISPENSE MEDICATIONS LISTED ON YOUR MEDICATION LIST TO YOU DURING YOUR ADMISSION [place]!    Patients discharged on the day of surgery will not be allowed to drive home.  Someone NEEDS to stay with you for the first  24 hours after anesthesia.   Please read over the following fact sheets you were given: IF [place]  If you received a COVID test during your pre-op visit  it is requested that you wear a mask when out in public, stay away from anyone that may not be feeling well and notify your surgeon if you develop symptoms. If you test positive for Covid or have been in contact with anyone that has tested positive in the last 10 days please notify you surgeon.  [facility] - Preparing for Surgery Before surgery, you can play an important role.  Because skin is not sterile, your skin needs to be as free of germs as possible.  You can reduce the number of germs on your skin by washing with CHG (chlorahexidine gluconate) soap before surgery.  CHG is an antiseptic cleaner which kills germs and bonds with the skin to continue killing germs even after washing. Please DO NOT use if you have an allergy to CHG or antibacterial soaps.  If your skin becomes reddened/irritated stop using the CHG and inform your nurse when you arrive at Short Stay. Do not shave (including legs and underarms) for at least 48 hours prior to the first CHG shower.  You may shave your face/neck.  Please follow these instructions carefully:  1.  Shower with CHG Soap the night before surgery and the  morning of surgery.  2.  If you choose to wash your hair, wash your hair first as usual with your normal  shampoo.  3.  After you shampoo, rinse your hair and body thoroughly to remove the shampoo.                             4.  Use CHG as you would any other liquid soap.  You can apply chg directly to the skin and wash.  Gently with a scrungie or clean washcloth.  5.  Apply the CHG Soap to your body ONLY FROM THE NECK DOWN.   Do   not use on face/ open                           Wound or open sores. Avoid contact with eyes, ears mouth and   genitals (private parts).                        Wash face,  Genitals (private parts) with your normal soap.             6.  Wash thoroughly, paying special attention to the area where your    surgery  will be performed.  7.  Thoroughly rinse your body with warm water from the neck down.  8.  DO NOT shower/wash with your normal soap after using and rinsing off the CHG Soap.                9.  Pat yourself dry with a clean towel.  10.  Wear clean pajamas.            11.  Place clean sheets on your bed the night of your first shower and do not  sleep with pets. Day of Surgery : Do not apply any lotions/deodorants the morning of surgery.  Please wear clean clothes to the hospital/surgery center.  FAILURE TO FOLLOW THESE INSTRUCTIONS MAY RESULT IN THE CANCELLATION OF YOUR SURGERY  PATIENT SIGNATURE_________________________________  NURSE SIGNATURE__________________________________  ________________________________________________________________________     Adam Phenix  An incentive spirometer is a tool that can help keep your lungs clear and active. This tool measures how well you are filling your lungs with each breath. Taking long deep breaths may help reverse or decrease the chance of developing breathing (pulmonary) problems (especially infection) following: A long period of time when you are unable to move or be active. BEFORE THE PROCEDURE  If the spirometer includes an indicator to show your best effort, your nurse or respiratory therapist will set it to a desired goal. If possible, sit up straight or lean slightly forward. Try not to slouch. Hold the incentive spirometer in an upright position. INSTRUCTIONS FOR USE  Sit on the edge of your bed if possible, or sit up as far as you can in bed or on a chair. Hold the incentive spirometer in an upright position. Breathe out normally. Place the mouthpiece in your mouth and seal your lips tightly around it. Breathe in slowly and as deeply as possible,  raising the piston or the ball toward the top of the column. Hold your breath for 3-5 seconds or for as long as possible. Allow the piston or ball to fall to the bottom of the column. Remove the mouthpiece from your mouth and breathe out normally. Rest for a few seconds and repeat Steps 1 through 7 at least 10 times every 1-2 hours when you are awake. Take your time and take a few normal breaths between deep breaths. The spirometer may include an indicator to show your best effort. Use the indicator as a goal to work toward during each repetition. After each set of 10 deep breaths, practice coughing to be sure your lungs are clear. If you have an incision (the cut made at the time of surgery), support your incision when coughing by placing a pillow or rolled up towels firmly against it. Once you are able to get out of bed, walk around indoors and cough well. You may stop using the incentive spirometer when instructed by your caregiver.  RISKS AND COMPLICATIONS Take your time so you do not get dizzy or light-headed. If you are in pain, you may need to take or ask for pain medication before doing incentive spirometry. It is harder to take a deep breath if you are having pain. AFTER USE Rest and breathe slowly and easily. It can be helpful to keep track of a log of your progress. Your caregiver can provide you with a simple table to help with this. If you are using the spirometer at home, follow these instructions: [place] IF:  You are having difficultly using the spirometer. You have trouble using the spirometer as often as instructed. Your pain medication is not giving enough relief while using the spirometer. You develop fever of 100.5 F (38.1 C) or higher. SEEK IMMEDIATE MEDICAL CARE IF:  You cough up bloody sputum that had not been present before. You develop fever of 102 F (38.9 C) or greater. You develop worsening pain  at or near the incision site. MAKE SURE YOU:  Understand  these instructions. Will watch your condition. Will get help right away if you are not doing well or get worse. Document Released: 09/26/2006 Document Revised: 08/08/2011 Document Reviewed: 11/27/2006 [facility] Patient Information 2014 [place], [place].   ________________________________________________________________________

## 2022-03-15 ENCOUNTER — Other Ambulatory Visit: Payer: Self-pay

## 2022-03-15 ENCOUNTER — Ambulatory Visit (HOSPITAL_COMMUNITY)
Admission: RE | Admit: 2022-03-15 | Discharge: 2022-03-15 | Disposition: A | Discharge status: Home / Self Care | Payer: PPO | Source: Ambulatory Visit | Attending: Orthopedic Surgery | Admitting: Orthopedic Surgery

## 2022-03-15 ENCOUNTER — Encounter (HOSPITAL_COMMUNITY): Payer: Self-pay

## 2022-03-15 ENCOUNTER — Encounter (HOSPITAL_COMMUNITY)
Admission: RE | Admit: 2022-03-15 | Discharge: 2022-03-15 | Disposition: A | Discharge status: Home / Self Care | Payer: PPO | Source: Ambulatory Visit | Attending: Orthopedic Surgery | Admitting: Orthopedic Surgery

## 2022-03-15 VITALS — BP 144/78 | HR 66 | Temp 98.3°F | Resp 18 | Ht 63.0 in | Wt 209.0 lb

## 2022-03-15 DIAGNOSIS — Z01818 Encounter for other preprocedural examination: Secondary | ICD-10-CM | POA: Insufficient documentation

## 2022-03-15 DIAGNOSIS — E119 Type 2 diabetes mellitus without complications: Secondary | ICD-10-CM | POA: Diagnosis not present

## 2022-03-15 DIAGNOSIS — I2510 Atherosclerotic heart disease of native coronary artery without angina pectoris: Secondary | ICD-10-CM

## 2022-03-15 DIAGNOSIS — D649 Anemia, unspecified: Secondary | ICD-10-CM

## 2022-03-15 HISTORY — DX: Gastro-esophageal reflux disease without esophagitis: K21.9

## 2022-03-15 HISTORY — DX: Anemia, unspecified: D64.9

## 2022-03-15 HISTORY — DX: Unspecified osteoarthritis, unspecified site: M19.90

## 2022-03-15 LAB — CBC
HCT: 31.2 % — ABNORMAL LOW (ref 36.0–46.0)
Hemoglobin: 9.4 g/dL — ABNORMAL LOW (ref 12.0–15.0)
MCH: 26.8 pg (ref 26.0–34.0)
MCHC: 30.1 g/dL (ref 30.0–36.0)
MCV: 88.9 fL (ref 80.0–100.0)
Platelets: 239 10*3/uL (ref 150–400)
RBC: 3.51 MIL/uL — ABNORMAL LOW (ref 3.87–5.11)
RDW: 16.2 % — ABNORMAL HIGH (ref 11.5–15.5)
WBC: 4.4 10*3/uL (ref 4.0–10.5)
nRBC: 0 % (ref 0.0–0.2)

## 2022-03-15 LAB — BASIC METABOLIC PANEL
Anion gap: 7 (ref 5–15)
BUN: 25 mg/dL — ABNORMAL HIGH (ref 8–23)
CO2: 27 mmol/L (ref 22–32)
Calcium: 9.4 mg/dL (ref 8.9–10.3)
Chloride: 109 mmol/L (ref 98–111)
Creatinine, Ser: 1.27 mg/dL — ABNORMAL HIGH (ref 0.44–1.00)
GFR, Estimated: 45 mL/min — ABNORMAL LOW (ref >60)
Glucose, Bld: 120 mg/dL — ABNORMAL HIGH (ref 70–99)
Potassium: 4.2 mmol/L (ref 3.5–5.1)
Sodium: 143 mmol/L (ref 135–145)

## 2022-03-15 LAB — HEMOGLOBIN A1C
Hgb A1c MFr Bld: 5.3 % (ref 4.8–5.6)
Mean Plasma Glucose: 105.41 mg/dL

## 2022-03-15 LAB — SURGICAL PCR SCREEN
MRSA, PCR: NEGATIVE
Staphylococcus aureus: NEGATIVE

## 2022-03-15 LAB — GLUCOSE, CAPILLARY: Glucose-Capillary: 127 mg/dL — ABNORMAL HIGH (ref 70–99)

## 2022-03-15 NOTE — Progress Notes (Addendum)
COVID Vaccine Completed:  Yes  Date of COVID positive in last 90 days:  No  PCP - [name], NP Cardiologist - N/A  Chest x-ray - 03-15-22 Epic EKG - 03-15-22 Epic Stress Test - several years ago ECHO - N/A Cardiac Cath - N/A Pacemaker/ICD device last checked: Spinal Cord Stimulator:N/A  Bowel Prep - N/A  Sleep Study - Yes, +sleep apnea CPAP - Yes  Fasting Blood Sugar - 110 to 136 Checks Blood Sugar - 1 time a day  Blood Thinner Instructions: Aspirin Instructions:  ASA 81.  Patient states she is to stop one week prior Last Dose:  Activity level:  Can go up a flight of stairs and perform activities of daily living without stopping and without symptoms of chest pain or shortness of breath.  Anesthesia review: Hemoglobin 9.4 on PAT labs  Patient denies shortness of breath, fever, cough and chest pain at PAT appointment  Patient verbalized understanding of instructions that were given to them at the PAT appointment. Patient was also instructed that they will need to review over the PAT instructions again at home before surgery.

## 2022-03-16 ENCOUNTER — Encounter (HOSPITAL_COMMUNITY): Payer: Self-pay | Admitting: Physician Assistant

## 2022-03-16 ENCOUNTER — Telehealth: Payer: Self-pay

## 2022-03-16 NOTE — Progress Notes (Signed)
Anesthesia Chart Review   Case: [id number] Date/Time: 03/21/22 1145   Procedure: TOTAL KNEE ARTHROPLASTY (Left: Knee)   Anesthesia type: Spinal   Pre-op diagnosis: LEFT KNEE DEGENERATIVE JOINT DISEASE   Location: [facility] ROOM 07 / [facility]   Surgeons: Dorna Leitz, MD       DISCUSSION:71 y.o. never smoker with h/o HTN, DM II, sleep apnea, left knee djd scheduled for above procedure 03/21/2022 with Dr. Dorna Leitz.   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 Last seen 01/20/2022.  She has received iron infusions in the past.  She is currently on iron supplementation. Labs forwarded to Dr. Berenice Primas.  Staff message sent to Dr. Julien Nordmann.  VS: BP (!) 144/78   Pulse 66   Temp 36.8 C (Oral)   Resp 18   Ht '5\' 3"'$  (1.6 m)   Wt 94.8 kg   SpO2 94%   BMI 37.02 kg/m   PROVIDERS: Carylon Perches, NP is PCP    LABS: Labs reviewed: Acceptable for surgery. (all labs ordered are listed, but only abnormal results are displayed)  Labs Reviewed  BASIC METABOLIC PANEL - Abnormal; Notable for the following components:      Result Value   Glucose, Bld 120 (*)    BUN 25 (*)    Creatinine, Ser 1.27 (*)    GFR, Estimated 45 (*)    All other components within normal limits  CBC - Abnormal; Notable for the following components:   RBC 3.51 (*)    Hemoglobin 9.4 (*)    HCT 31.2 (*)    RDW 16.2 (*)    All other components within normal limits  GLUCOSE, CAPILLARY - Abnormal; Notable for the following components:   Glucose-Capillary 127 (*)    All other components within normal limits  SURGICAL PCR SCREEN  HEMOGLOBIN A1C     IMAGES:   EKG:   CV:  Past Medical History:  Diagnosis Date   Anemia    Arthritis    Diabetes mellitus without complication (HCC)    GERD (gastroesophageal reflux disease)    Hypercholesterolemia    Hypertension    Sleep apnea     Past Surgical History:  Procedure Laterality Date   ABDOMINAL HYSTERECTOMY      BREAST REDUCTION SURGERY     CARPAL TUNNEL RELEASE     HERNIA REPAIR     NASAL SEPTUM SURGERY     REDUCTION MAMMAPLASTY     app. 1980    MEDICATIONS:  amLODipine-valsartan (EXFORGE) 10-320 MG tablet   aspirin EC 81 MG tablet   Biotin w/ Vitamins C & E (HAIR SKIN & NAILS GUMMIES PO)   carvedilol (COREG) 25 MG tablet   diphenhydrAMINE (BENADRYL) 25 MG tablet   ferrous sulfate 325 (65 FE) MG tablet   fluticasone (FLONASE) 50 MCG/ACT nasal spray   glipiZIDE (GLUCOTROL XL) 10 MG 24 hr tablet   HYDROcodone-acetaminophen (NORCO) 10-325 MG tablet   leflunomide (ARAVA) 20 MG tablet   Melaton-Thean-Cham-PassF-LBalm (MELATONIN + L-THEANINE PO)   metFORMIN (GLUCOPHAGE) 500 MG tablet   Multiple Vitamin (MULTI-VITAMIN) tablet   Omega-3 Fatty Acids (FISH OIL) 1000 MG CAPS   omeprazole (PRILOSEC) 20 MG capsule   pregabalin (LYRICA) 75 MG capsule   simvastatin (ZOCOR) 40 MG tablet   torsemide (DEMADEX) 10 MG tablet   traZODone (DESYREL) 50 MG tablet   No current facility-administered medications for this encounter.    Konrad Felix Ward, PA-C [facility] Pre-Surgical Testing [phone]

## 2022-03-16 NOTE — Telephone Encounter (Signed)
This patient called in and stated that she is scheduled for a knee replacement on 10/23 and was advised that her hemoglobin was low and she needed to reach out to this clinic to see if she can receive an iron infusion prior to surgery.  This request forwarded to MD for recommendation.  No further questions noted.

## 2022-03-18 ENCOUNTER — Telehealth: Payer: Self-pay | Admitting: Medical Oncology

## 2022-03-18 NOTE — Telephone Encounter (Signed)
Pt stated  " I need iron before surgery on Monday". I told pt that she needs to contact  Dr Berenice Primas about her concern . Julien Nordmann will be happy to see her in the near future with iron studies .

## 2022-03-21 ENCOUNTER — Encounter (HOSPITAL_COMMUNITY): Admission: RE | Payer: Self-pay | Source: Ambulatory Visit

## 2022-03-21 ENCOUNTER — Encounter: Payer: Self-pay | Admitting: Physician Assistant

## 2022-03-21 ENCOUNTER — Ambulatory Visit (HOSPITAL_COMMUNITY): Admission: RE | Admit: 2022-03-21 | Payer: PPO | Source: Ambulatory Visit | Admitting: Orthopedic Surgery

## 2022-03-21 DIAGNOSIS — I2510 Atherosclerotic heart disease of native coronary artery without angina pectoris: Secondary | ICD-10-CM

## 2022-03-21 DIAGNOSIS — E119 Type 2 diabetes mellitus without complications: Secondary | ICD-10-CM

## 2022-03-21 DIAGNOSIS — D649 Anemia, unspecified: Secondary | ICD-10-CM

## 2022-03-21 DIAGNOSIS — Z01818 Encounter for other preprocedural examination: Secondary | ICD-10-CM

## 2022-03-21 SURGERY — ARTHROPLASTY, KNEE, TOTAL
Anesthesia: Spinal | Site: Knee | Laterality: Left

## 2022-03-25 ENCOUNTER — Other Ambulatory Visit: Payer: Self-pay

## 2022-03-28 ENCOUNTER — Telehealth: Payer: Self-pay | Admitting: Pharmacy Technician

## 2022-03-28 ENCOUNTER — Other Ambulatory Visit: Payer: Self-pay

## 2022-03-28 ENCOUNTER — Inpatient Hospital Stay: Payer: PPO | Admitting: Internal Medicine

## 2022-03-28 ENCOUNTER — Inpatient Hospital Stay: Payer: PPO | Attending: Internal Medicine

## 2022-03-28 ENCOUNTER — Other Ambulatory Visit: Payer: Self-pay | Admitting: Medical Oncology

## 2022-03-28 ENCOUNTER — Other Ambulatory Visit: Payer: Self-pay | Admitting: Pharmacy Technician

## 2022-03-28 VITALS — BP 134/50 | HR 72 | Temp 98.1°F | Resp 16 | Wt 211.0 lb

## 2022-03-28 DIAGNOSIS — D539 Nutritional anemia, unspecified: Secondary | ICD-10-CM

## 2022-03-28 DIAGNOSIS — R5383 Other fatigue: Secondary | ICD-10-CM | POA: Diagnosis not present

## 2022-03-28 DIAGNOSIS — Z79899 Other long term (current) drug therapy: Secondary | ICD-10-CM | POA: Diagnosis not present

## 2022-03-28 DIAGNOSIS — D649 Anemia, unspecified: Secondary | ICD-10-CM | POA: Insufficient documentation

## 2022-03-28 DIAGNOSIS — E538 Deficiency of other specified B group vitamins: Secondary | ICD-10-CM | POA: Insufficient documentation

## 2022-03-28 LAB — CMP (CANCER CENTER ONLY)
ALT: 10 U/L (ref 0–44)
AST: 16 U/L (ref 15–41)
Albumin: 3.9 g/dL (ref 3.5–5.0)
Alkaline Phosphatase: 25 U/L — ABNORMAL LOW (ref 38–126)
Anion gap: 7 (ref 5–15)
BUN: 25 mg/dL — ABNORMAL HIGH (ref 8–23)
CO2: 28 mmol/L (ref 22–32)
Calcium: 9.5 mg/dL (ref 8.9–10.3)
Chloride: 107 mmol/L (ref 98–111)
Creatinine: 1.4 mg/dL — ABNORMAL HIGH (ref 0.44–1.00)
GFR, Estimated: 40 mL/min — ABNORMAL LOW (ref >60)
Glucose, Bld: 174 mg/dL — ABNORMAL HIGH (ref 70–99)
Potassium: 4 mmol/L (ref 3.5–5.1)
Sodium: 142 mmol/L (ref 135–145)
Total Bilirubin: 0.4 mg/dL (ref 0.3–1.2)
Total Protein: 7.4 g/dL (ref 6.5–8.1)

## 2022-03-28 LAB — CBC WITH DIFFERENTIAL (CANCER CENTER ONLY)
Abs Immature Granulocytes: 0.01 10*3/uL (ref 0.00–0.07)
Basophils Absolute: 0.1 10*3/uL (ref 0.0–0.1)
Basophils Relative: 2 %
Eosinophils Absolute: 0.3 10*3/uL (ref 0.0–0.5)
Eosinophils Relative: 6 %
HCT: 29.9 % — ABNORMAL LOW (ref 36.0–46.0)
Hemoglobin: 9.4 g/dL — ABNORMAL LOW (ref 12.0–15.0)
Immature Granulocytes: 0 %
Lymphocytes Relative: 40 %
Lymphs Abs: 1.7 10*3/uL (ref 0.7–4.0)
MCH: 27.1 pg (ref 26.0–34.0)
MCHC: 31.4 g/dL (ref 30.0–36.0)
MCV: 86.2 fL (ref 80.0–100.0)
Monocytes Absolute: 0.5 10*3/uL (ref 0.1–1.0)
Monocytes Relative: 13 %
Neutro Abs: 1.6 10*3/uL — ABNORMAL LOW (ref 1.7–7.7)
Neutrophils Relative %: 39 %
Platelet Count: 229 10*3/uL (ref 150–400)
RBC: 3.47 MIL/uL — ABNORMAL LOW (ref 3.87–5.11)
RDW: 16.2 % — ABNORMAL HIGH (ref 11.5–15.5)
WBC Count: 4.1 10*3/uL (ref 4.0–10.5)
nRBC: 0 % (ref 0.0–0.2)

## 2022-03-28 LAB — IRON AND IRON BINDING CAPACITY (CC-WL,HP ONLY)
Iron: 55 ug/dL (ref 28–170)
Saturation Ratios: 23 % (ref 10.4–31.8)
TIBC: 241 ug/dL — ABNORMAL LOW (ref 250–450)
UIBC: 186 ug/dL (ref 148–442)

## 2022-03-28 LAB — FERRITIN: Ferritin: 318 ng/mL — ABNORMAL HIGH (ref 11–307)

## 2022-03-28 LAB — VITAMIN B12: Vitamin B-12: 439 pg/mL (ref 180–914)

## 2022-03-28 NOTE — Progress Notes (Signed)
[facility] Telephone:([phone]) [phone]   Fax:([phone]  OFFICE PROGRESS NOTE  [name], NP [name] [address]  DIAGNOSIS: Normocytic anemia with a combination of iron deficiency as well as vitamin B12 deficiency.  PRIOR THERAPY: She is status post treatment with Venofer 200 Mg IV weekly for 4 doses in addition to vitamin B12 injections.  CURRENT THERAPY: Ferrous sulfate 325 mg p.o. daily.  INTERVAL HISTORY: Meredith Cline 71 y.o. female returns to the clinic today for follow-up visit.  The patient was supposed to have knee replacement but her hemoglobin was low and her surgery was postponed.  She denied having any current chest pain, shortness of breath, cough or hemoptysis.  She has no nausea, vomiting, diarrhea or constipation.  She continues to have mild fatigue but no dizzy spells or craving for ice.  She denied having any recent weight loss or night sweats.  She has been on oral iron tablet with ferrous sulfate but she does not take any vitamin B supplements.  She was referred to me today for evaluation and consideration of IV iron infusion to improve her hemoglobin before the surgery.    MEDICAL HISTORY: Past Medical History:  Diagnosis Date   Anemia    Arthritis    Diabetes mellitus without complication (HCC)    GERD (gastroesophageal reflux disease)    Hypercholesterolemia    Hypertension    Sleep apnea     ALLERGIES:  is allergic to gabapentin, clindamycin, and penicillins.  MEDICATIONS:  Current Outpatient Medications  Medication Sig Dispense Refill   amLODipine-valsartan (EXFORGE) 10-320 MG tablet Take 1 tablet by mouth daily.     aspirin EC 81 MG tablet Take 81 mg by mouth daily.     Biotin w/ Vitamins C & E (HAIR SKIN & NAILS GUMMIES PO) Take 2 each by mouth daily.     carvedilol (COREG) 25 MG tablet Take 25 mg by mouth 2 (two) times daily.     diphenhydrAMINE (BENADRYL) 25 MG tablet Take 25 mg by mouth daily as  needed for allergies.     ferrous sulfate 325 (65 FE) MG tablet Take 325 mg by mouth daily with breakfast.     fluticasone (FLONASE) 50 MCG/ACT nasal spray Place 2 sprays into both nostrils daily as needed for allergies or rhinitis.     glipiZIDE (GLUCOTROL XL) 10 MG 24 hr tablet Take 10 mg by mouth daily.     HYDROcodone-acetaminophen (NORCO) 10-325 MG tablet Take 1 tablet by mouth 3 (three) times daily as needed.     leflunomide (ARAVA) 20 MG tablet Take 20 mg by mouth daily.     Melaton-Thean-Cham-PassF-LBalm (MELATONIN + L-THEANINE PO) Take 1 tablet by mouth at bedtime as needed (sleep).     metFORMIN (GLUCOPHAGE) 500 MG tablet Take 500 mg by mouth 2 (two) times daily with a meal.     Multiple Vitamin (MULTI-VITAMIN) tablet Take 1 tablet by mouth in the morning and at bedtime.     Omega-3 Fatty Acids (FISH OIL) 1000 MG CAPS Take 1,000 mg by mouth in the morning and at bedtime.     omeprazole (PRILOSEC) 20 MG capsule Take 20 mg by mouth daily.     pregabalin (LYRICA) 75 MG capsule Take 75 mg by mouth 2 (two) times daily.     simvastatin (ZOCOR) 40 MG tablet Take 40 mg by mouth daily.     torsemide (DEMADEX) 10 MG tablet Take 10 mg by mouth  daily.     traZODone (DESYREL) 50 MG tablet Take 50 mg by mouth at bedtime as needed for sleep.     No current facility-administered medications for this visit.    SURGICAL HISTORY:  Past Surgical History:  Procedure Laterality Date   ABDOMINAL HYSTERECTOMY     BREAST REDUCTION SURGERY     CARPAL TUNNEL RELEASE     HERNIA REPAIR     NASAL SEPTUM SURGERY     REDUCTION MAMMAPLASTY     app. 1980    REVIEW OF SYSTEMS:  A comprehensive review of systems was negative except for: Constitutional: positive for fatigue   PHYSICAL EXAMINATION: General appearance: alert, cooperative, fatigued, and no distress Head: Normocephalic, without obvious abnormality, atraumatic Neck: no adenopathy, no JVD, supple, symmetrical, trachea midline, and thyroid not  enlarged, symmetric, no tenderness/mass/nodules Lymph nodes: Cervical, supraclavicular, and axillary nodes normal. Resp: clear to auscultation bilaterally Back: symmetric, no curvature. ROM normal. No CVA tenderness. Cardio: regular rate and rhythm, S1, S2 normal, no murmur, click, rub or gallop GI: soft, non-tender; bowel sounds normal; no masses,  no organomegaly Extremities: extremities normal, atraumatic, no cyanosis or edema  ECOG PERFORMANCE STATUS: 1 - Symptomatic but completely ambulatory  Blood pressure (!) 134/50, pulse 72, temperature 98.1 F (36.7 C), temperature source Oral, resp. rate 16, weight 211 lb (95.7 kg), SpO2 93 %.  LABORATORY DATA: Lab Results  Component Value Date   WBC 4.1 03/28/2022   HGB 9.4 (L) 03/28/2022   HCT 29.9 (L) 03/28/2022   MCV 86.2 03/28/2022   PLT 229 03/28/2022      Chemistry      Component Value Date/Time   NA 142 03/28/2022 0956   K 4.0 03/28/2022 0956   CL 107 03/28/2022 0956   CO2 28 03/28/2022 0956   BUN 25 (H) 03/28/2022 0956   CREATININE 1.40 (H) 03/28/2022 0956      Component Value Date/Time   CALCIUM 9.5 03/28/2022 0956   ALKPHOS 25 (L) 03/28/2022 0956   AST 16 03/28/2022 0956   ALT 10 03/28/2022 0956   BILITOT 0.4 03/28/2022 0956       RADIOGRAPHIC STUDIES: DG Chest 2 View  Result Date: 03/16/2022 CLINICAL DATA:  Preoperative exam EXAM: CHEST - 2 VIEW COMPARISON:  Chest radiograph February 21, 2020 FINDINGS: Cardiac contours upper limits of normal. Aortic atherosclerosis. No consolidative opacities. No pleural effusion or pneumothorax. Thoracic spine degenerative changes. IMPRESSION: No active cardiopulmonary disease. Electronically Signed   By: Lovey Newcomer M.D.   On: 03/16/2022 06:59    ASSESSMENT AND PLAN: This is a very pleasant 71 years old African-American female with normocytic anemia likely secondary to mixture of iron deficiency as well as vitamin B12 deficiency.  The patient was treated with iron infusion  with Venofer in addition to weekly vitamin B12 injection. The patient is doing fine with no concerning complaints except for mild fatigue but she is supposed to have knee surgery and her hemoglobin is still on the low side.  She was referred back for consideration of IV iron infusion. I will arrange for the patient to receive iron infusion with Venofer 300 Mg IV weekly for 3 weeks at the [facility] infusion center. I will see her back for follow-up visit in 2 months for evaluation with repeat blood work. I also advised the patient to continue with the oral ferrous sulfate as well as vitamin B12 supplements. She was advised to call immediately if she has any other concerning symptoms in the  interval. The patient voices understanding of current disease status and treatment options and is in agreement with the current care plan. All questions were answered. The patient knows to call the clinic with any problems, questions or concerns. We can certainly see the patient much sooner if necessary.  The total time spent in the appointment was 20 minutes.  Disclaimer: This note was dictated with voice recognition software. Similar sounding words can inadvertently be transcribed and may not be corrected upon review.

## 2022-03-28 NOTE — Telephone Encounter (Signed)
Dr. Toya Smothers, Meredith Cline note:  Auth Submission: NO AUTH NEEDED Payer: Jed Limerick ADVT Medication & CPT/J Code(s) submitted: Venofer (Iron Sucrose) J1756 Route of submission (phone, fax, portal):  Phone # [phone] Fax # Auth type: Buy/Bill Units/visits requested:X3 Reference number: [id number] Approval from:  03/28/22 - 05/29/22  Patient will be scheduled as soon as possible

## 2022-04-05 ENCOUNTER — Ambulatory Visit (INDEPENDENT_AMBULATORY_CARE_PROVIDER_SITE_OTHER): Payer: PPO

## 2022-04-05 VITALS — BP 118/57 | HR 69 | Temp 98.0°F | Resp 16 | Ht 63.0 in | Wt 213.2 lb

## 2022-04-05 DIAGNOSIS — D539 Nutritional anemia, unspecified: Secondary | ICD-10-CM

## 2022-04-05 MED ORDER — DIPHENHYDRAMINE HCL 25 MG PO CAPS
25.0000 mg | ORAL_CAPSULE | Freq: Once | ORAL | Status: AC
  Administered 2022-04-05: 25 mg via ORAL
  Filled 2022-04-05: qty 1

## 2022-04-05 MED ORDER — ACETAMINOPHEN 325 MG PO TABS
650.0000 mg | ORAL_TABLET | Freq: Once | ORAL | Status: AC
  Administered 2022-04-05: 650 mg via ORAL
  Filled 2022-04-05: qty 2

## 2022-04-05 MED ORDER — SODIUM CHLORIDE 0.9 % IV SOLN
300.0000 mg | INTRAVENOUS | Status: DC
  Administered 2022-04-05: 300 mg via INTRAVENOUS
  Filled 2022-04-05: qty 15

## 2022-04-05 NOTE — Progress Notes (Signed)
Diagnosis: Iron Deficiency Anemia  Provider:  Marshell Garfinkel MD  Procedure: Infusion  IV Type: Peripheral, IV Location: R Forearm  Venofer (Iron Sucrose), Dose: 300 mg  Infusion Start Time: 1829  Infusion Stop Time: 1330  Post Infusion IV Care: Observation period completed and Peripheral IV Discontinued  Discharge: Condition: Good, Destination: Home . AVS provided to patient.   Performed by:  Adelina Mings, LPN

## 2022-04-12 ENCOUNTER — Ambulatory Visit (INDEPENDENT_AMBULATORY_CARE_PROVIDER_SITE_OTHER): Payer: PPO

## 2022-04-12 VITALS — BP 135/61 | HR 68 | Temp 98.2°F | Resp 18 | Ht 63.0 in | Wt 216.0 lb

## 2022-04-12 DIAGNOSIS — D539 Nutritional anemia, unspecified: Secondary | ICD-10-CM

## 2022-04-12 MED ORDER — ACETAMINOPHEN 325 MG PO TABS
650.0000 mg | ORAL_TABLET | Freq: Once | ORAL | Status: AC
  Administered 2022-04-12: 650 mg via ORAL
  Filled 2022-04-12: qty 2

## 2022-04-12 MED ORDER — DIPHENHYDRAMINE HCL 25 MG PO CAPS
25.0000 mg | ORAL_CAPSULE | Freq: Once | ORAL | Status: AC
  Administered 2022-04-12: 25 mg via ORAL
  Filled 2022-04-12: qty 1

## 2022-04-12 MED ORDER — SODIUM CHLORIDE 0.9 % IV SOLN
300.0000 mg | INTRAVENOUS | Status: DC
  Administered 2022-04-12: 300 mg via INTRAVENOUS
  Filled 2022-04-12: qty 15

## 2022-04-12 NOTE — Progress Notes (Signed)
Diagnosis: Iron Deficiency Anemia  Provider:  Marshell Garfinkel MD  Procedure: Infusion  IV Type: Peripheral, IV Location: R Forearm  Venofer (Iron Sucrose), Dose: 300 mg  Infusion Start Time: 5597  Infusion Stop Time: 4163  Post Infusion IV Care: Peripheral IV Discontinued  Discharge: Condition: Good, Destination: Home . AVS provided to patient.   Performed by:  Adelina Mings, LPN

## 2022-04-19 ENCOUNTER — Ambulatory Visit (INDEPENDENT_AMBULATORY_CARE_PROVIDER_SITE_OTHER): Payer: PPO | Admitting: *Deleted

## 2022-04-19 VITALS — BP 120/61 | HR 71 | Temp 98.2°F | Resp 18 | Ht 63.0 in | Wt 206.0 lb

## 2022-04-19 DIAGNOSIS — D539 Nutritional anemia, unspecified: Secondary | ICD-10-CM | POA: Diagnosis not present

## 2022-04-19 MED ORDER — DIPHENHYDRAMINE HCL 25 MG PO CAPS
25.0000 mg | ORAL_CAPSULE | Freq: Once | ORAL | Status: AC
  Administered 2022-04-19: 25 mg via ORAL
  Filled 2022-04-19: qty 1

## 2022-04-19 MED ORDER — ACETAMINOPHEN 325 MG PO TABS
650.0000 mg | ORAL_TABLET | Freq: Once | ORAL | Status: AC
  Administered 2022-04-19: 650 mg via ORAL
  Filled 2022-04-19: qty 2

## 2022-04-19 MED ORDER — SODIUM CHLORIDE 0.9 % IV SOLN
300.0000 mg | INTRAVENOUS | Status: DC
  Administered 2022-04-19: 300 mg via INTRAVENOUS
  Filled 2022-04-19: qty 15

## 2022-04-19 NOTE — Progress Notes (Signed)
Diagnosis: Iron Deficiency Anemia  Provider:  Marshell Garfinkel MD  Procedure: Infusion  IV Type: Peripheral, IV Location: R Forearm  Venofer (Iron Sucrose), Dose: 390 mg  Infusion Start Time: 3291 am  Infusion Stop Time: 1350 pm  Post Infusion IV Care: Observation period completed and Peripheral IV Discontinued  Discharge: Condition: Good, Destination: Home . AVS provided to patient.   Performed by:  Oren Beckmann, RN

## 2022-06-01 ENCOUNTER — Other Ambulatory Visit: Payer: Self-pay | Admitting: Nurse Practitioner

## 2022-06-01 DIAGNOSIS — Z1231 Encounter for screening mammogram for malignant neoplasm of breast: Secondary | ICD-10-CM

## 2022-06-01 IMAGING — MG DIGITAL SCREENING BILAT W/ TOMO W/ CAD
8 series · 8 of 24 positions shown · non-contrast
Comparison: Previous exam(s).

CLINICAL DATA: Screening.

EXAM:
DIGITAL SCREENING BILATERAL MAMMOGRAM WITH TOMO AND CAD

[R MLO synth-2D]
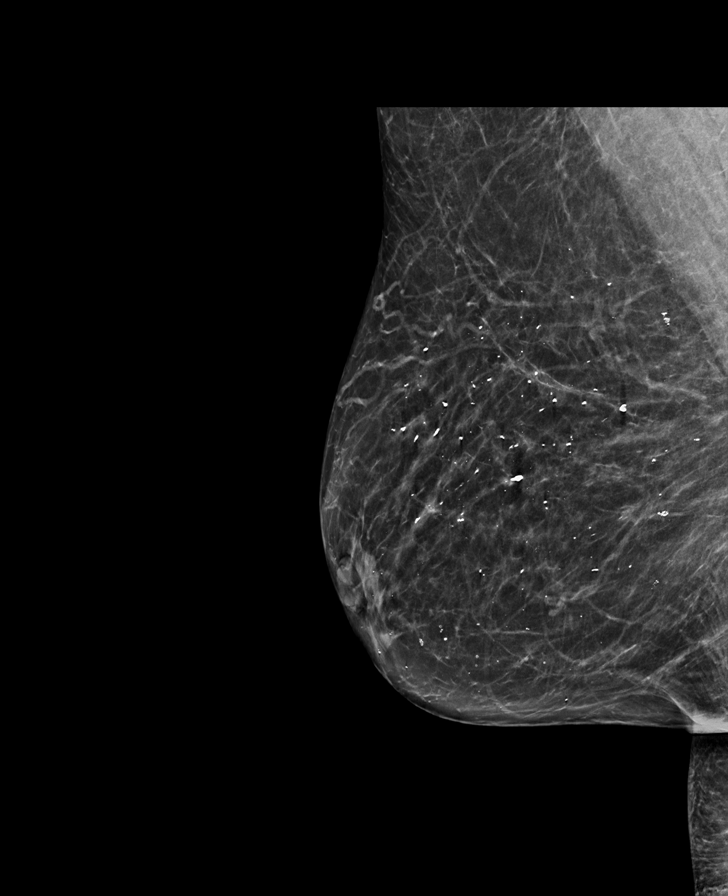

[L MLO synth-2D]
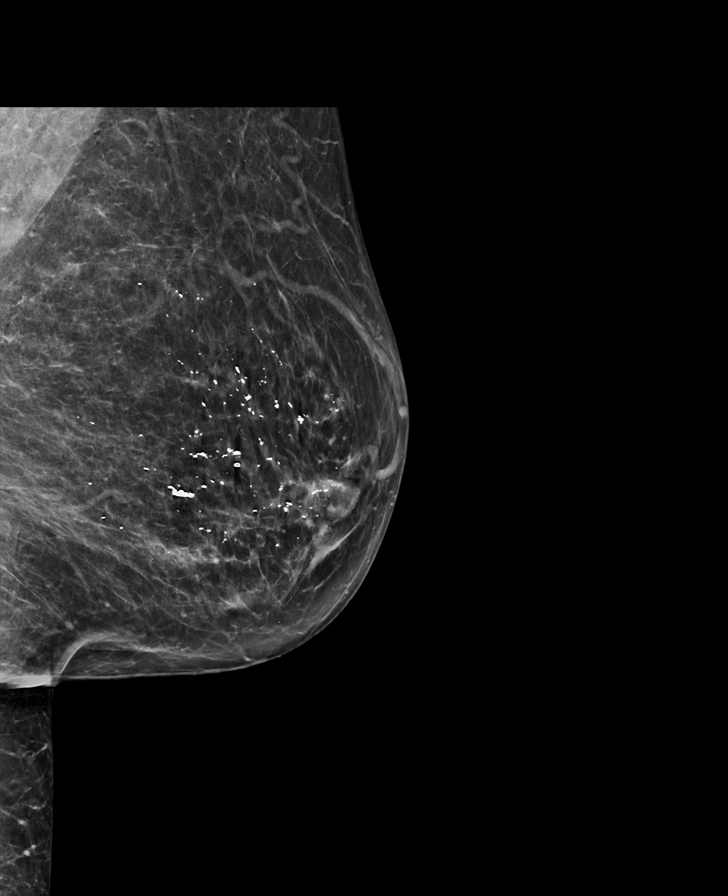

[L CC synth-2D]
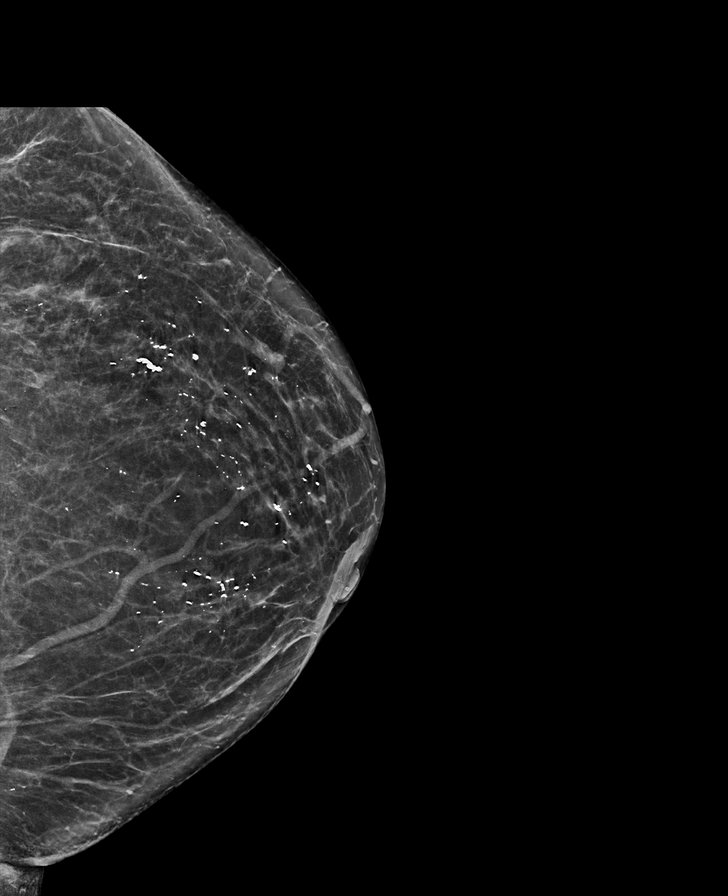

[R CC synth-2D]
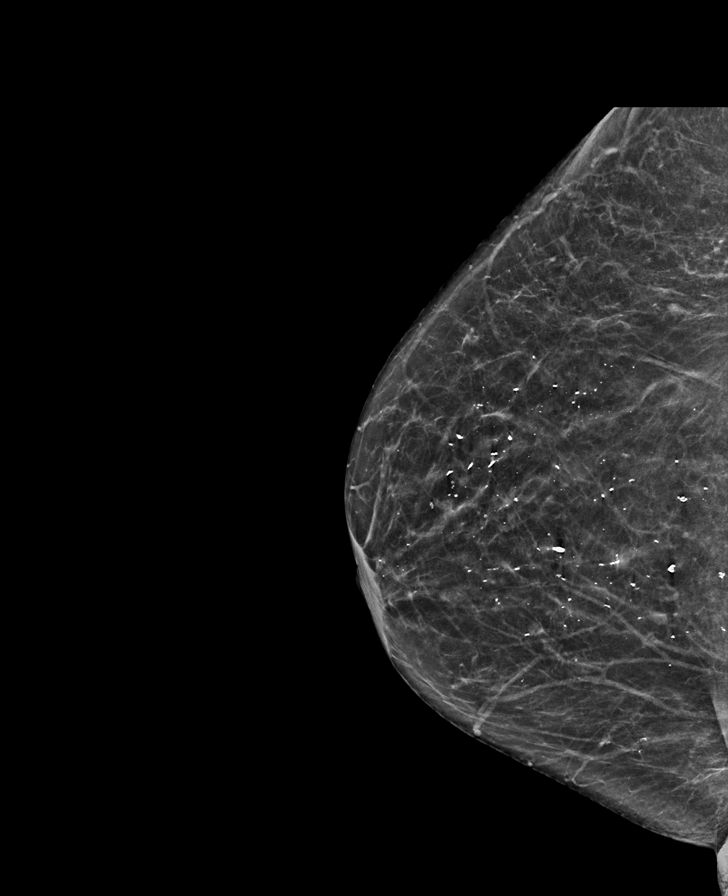

[L CC tomo · tomo slice 32/63.0]
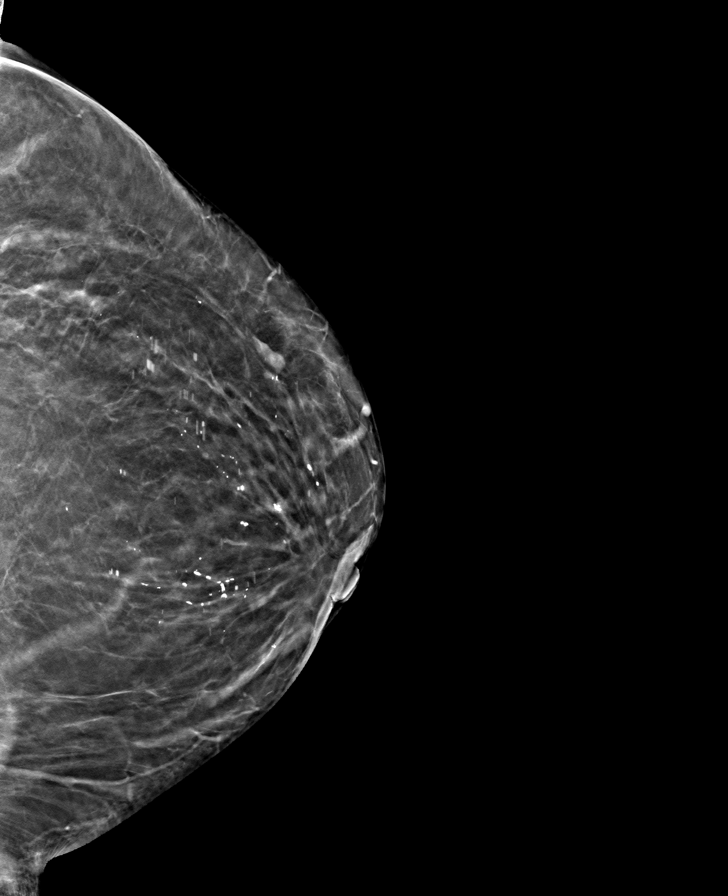

[R MLO tomo · tomo slice 33/64.0]
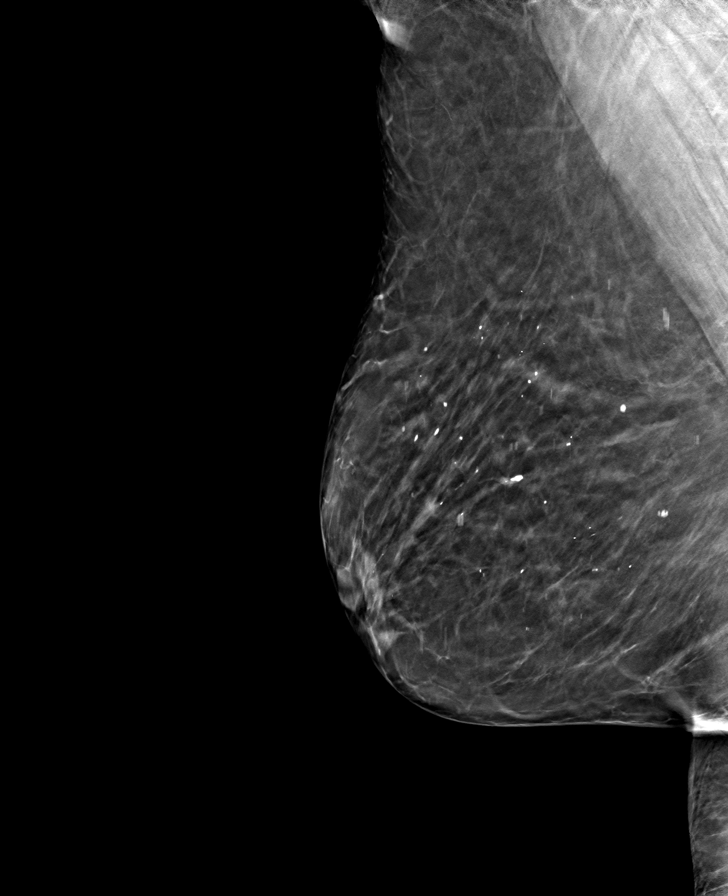

[L MLO tomo · tomo slice 36/71.0]
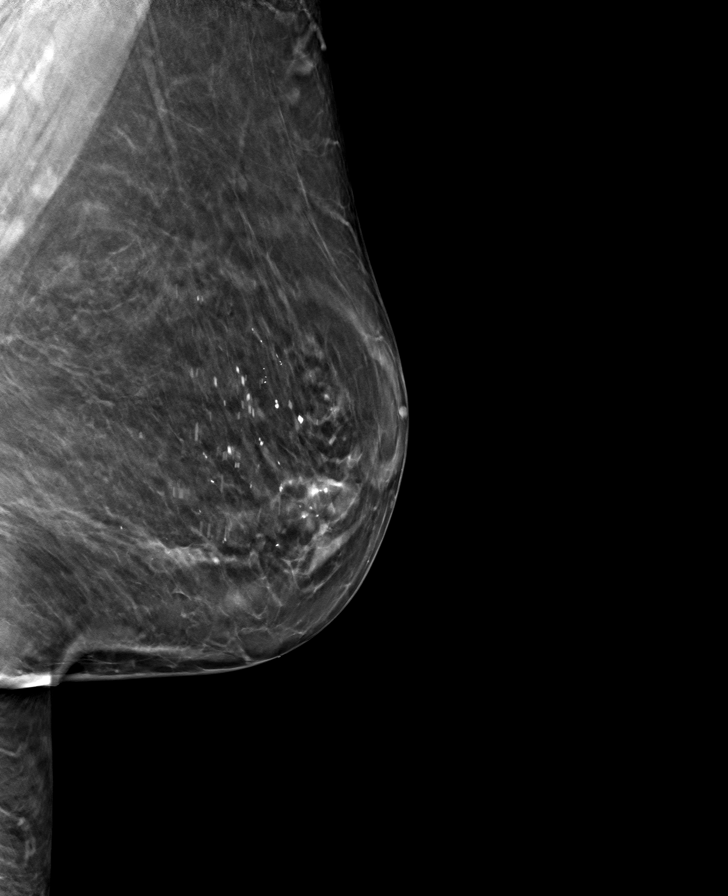

[R CC tomo · tomo slice 29/56.0]
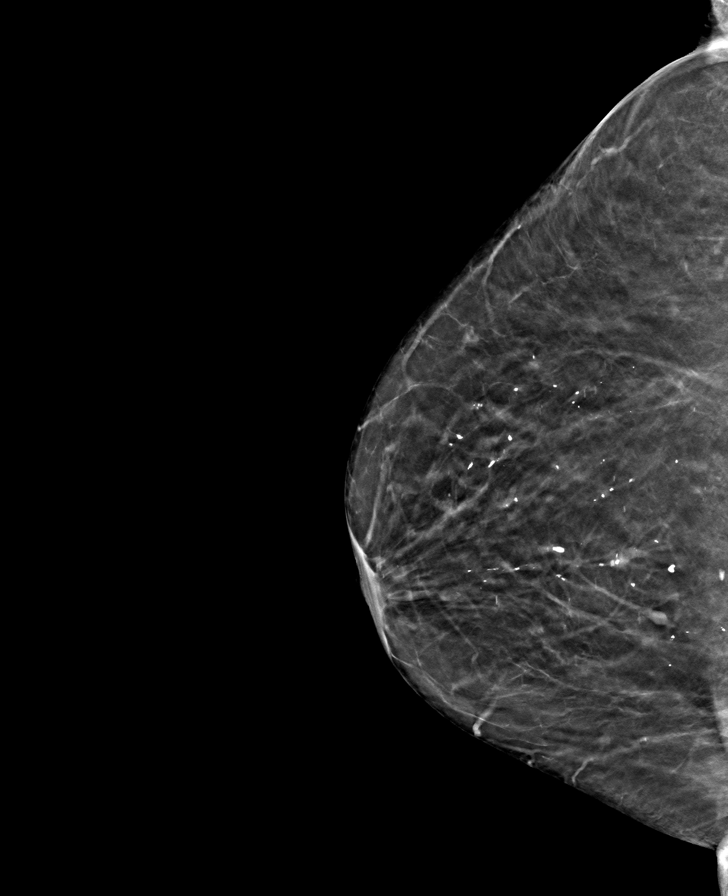

[8 of 24 positions shown; findings below may reference images not displayed]

ACR Breast Density Category b: There are scattered areas of
fibroglandular density.
FINDINGS: There are no findings suspicious for malignancy. Images were
processed with CAD.
IMPRESSION: No mammographic evidence of malignancy. A result letter of this
screening mammogram will be mailed directly to the patient.

RECOMMENDATION:
Screening mammogram in one year. (Code:[id number])

BI-RADS CATEGORY  1: Negative.

## 2022-06-02 ENCOUNTER — Telehealth: Payer: Self-pay | Admitting: Physician Assistant

## 2022-06-02 NOTE — Telephone Encounter (Signed)
Called patient regarding upcoming January appointments, patient is notified. 

## 2022-06-06 ENCOUNTER — Ambulatory Visit: Payer: PPO | Admitting: Internal Medicine

## 2022-06-06 ENCOUNTER — Other Ambulatory Visit: Payer: PPO

## 2022-06-08 NOTE — Progress Notes (Unsigned)
[place] [facility] OFFICE PROGRESS NOTE  [name], NP [place] [address]  DIAGNOSIS: Normocytic anemia with a combination of iron deficiency as well as vitamin B12 deficiency. Reportedly diagnosed with RA  PRIOR THERAPY: She is status post treatment vitamin B12 injections.   CURRENT THERAPY: 1) IV iron with Venofer 300 mg as needed most recent dose on 04/19/2022 2) ferrous sulfate 325 mg p.o. daily.  INTERVAL HISTORY: Meredith Cline 72 y.o. female returns to the clinic today for a 24-monthfollow-up visit.  The patient was last seen by Dr. MJulien Nordmannon 03/28/2022.  The patient was hoping to undergo knee surgery but was having iron deficiency anemia at that time and her surgery was postponed.  Therefore, she had IV iron infusions in November 2023, the most recent being on 04/19/2022. Fortunately, the patient's knee no longer hurts and she is not planning on having a knee replacement.   She is compliant with her oral iron supplements.  She was previously found to have B12 deficiency for which she underwent B12 injections.  She is no longer taking B12 injections and takes 1000 of Oral B12.  Overall her energy is "so-so".  She denies any lightheadedness, dyspnea on exertion, palpitations, or chest pain.  Denies any abnormal bleeding or bruising including epistaxis, gingival bleeding, hemoptysis, hematemesis, melena, or hematochezia.  She is reportedly up to date on her colonoscopy. Her stool cards from when initially referred here were negative for blood. She mentions she has rheumatoid arthritis. She is here today for evaluation repeat blood work.  MEDICAL HISTORY: Past Medical History:  Diagnosis Date   Anemia    Arthritis    Diabetes mellitus without complication (HCC)    GERD (gastroesophageal reflux disease)    Hypercholesterolemia    Hypertension    Sleep apnea     ALLERGIES:  is allergic to gabapentin, clindamycin, and penicillins.  MEDICATIONS:   Current Outpatient Medications  Medication Sig Dispense Refill   amLODipine-valsartan (EXFORGE) 10-320 MG tablet Take 1 tablet by mouth daily.     aspirin EC 81 MG tablet Take 81 mg by mouth daily.     Biotin w/ Vitamins C & E (HAIR SKIN & NAILS GUMMIES PO) Take 2 each by mouth daily.     carvedilol (COREG) 25 MG tablet Take 25 mg by mouth 2 (two) times daily.     diphenhydrAMINE (BENADRYL) 25 MG tablet Take 25 mg by mouth daily as needed for allergies.     ferrous sulfate 325 (65 FE) MG tablet Take 325 mg by mouth daily with breakfast.     fluticasone (FLONASE) 50 MCG/ACT nasal spray Place 2 sprays into both nostrils daily as needed for allergies or rhinitis.     glipiZIDE (GLUCOTROL XL) 10 MG 24 hr tablet Take 10 mg by mouth daily.     HYDROcodone-acetaminophen (NORCO) 10-325 MG tablet Take 1 tablet by mouth 3 (three) times daily as needed.     leflunomide (ARAVA) 20 MG tablet Take 20 mg by mouth daily.     Melaton-Thean-Cham-PassF-LBalm (MELATONIN + L-THEANINE PO) Take 1 tablet by mouth at bedtime as needed (sleep).     metFORMIN (GLUCOPHAGE) 500 MG tablet Take 500 mg by mouth 2 (two) times daily with a meal.     Multiple Vitamin (MULTI-VITAMIN) tablet Take 1 tablet by mouth in the morning and at bedtime.     Omega-3 Fatty Acids (FISH OIL) 1000 MG CAPS Take 1,000 mg by mouth in the morning and  at bedtime.     omeprazole (PRILOSEC) 20 MG capsule Take 20 mg by mouth daily.     pregabalin (LYRICA) 75 MG capsule Take 75 mg by mouth 2 (two) times daily.     simvastatin (ZOCOR) 40 MG tablet Take 40 mg by mouth daily.     torsemide (DEMADEX) 10 MG tablet Take 10 mg by mouth daily.     traZODone (DESYREL) 50 MG tablet Take 50 mg by mouth at bedtime as needed for sleep.     No current facility-administered medications for this visit.    SURGICAL HISTORY:  Past Surgical History:  Procedure Laterality Date   ABDOMINAL HYSTERECTOMY     BREAST REDUCTION SURGERY     CARPAL TUNNEL RELEASE      HERNIA REPAIR     NASAL SEPTUM SURGERY     REDUCTION MAMMAPLASTY     app. 1980    REVIEW OF SYSTEMS:   Review of Systems  Constitutional: Positive for some fatigue. Negative for appetite change, chills, fever and unexpected weight change.  HENT: Negative for mouth sores, nosebleeds, sore throat and trouble swallowing.   Eyes: Negative for eye problems and icterus.  Respiratory: Negative for hemoptysis, shortness of breath and wheezing.   Cardiovascular: Negative for chest pain and leg swelling.  Gastrointestinal: Negative for abdominal pain, constipation, diarrhea, nausea and vomiting.  Genitourinary: Negative for bladder incontinence, difficulty urinating, dysuria, frequency and hematuria.   Musculoskeletal: Negative for back pain, gait problem, neck pain and neck stiffness.  Skin: Negative for itching and rash.  Neurological: Negative for dizziness, extremity weakness, gait problem, headaches, light-headedness and seizures.  Hematological: Negative for adenopathy. Does not bruise/bleed easily.  Psychiatric/Behavioral: Negative for confusion, depression and sleep disturbance. The patient is not nervous/anxious.     PHYSICAL EXAMINATION:  Blood pressure (!) 152/70, pulse 80, temperature 98.3 F (36.8 C), temperature source Oral, resp. rate 15, weight 215 lb 6.4 oz (97.7 kg), SpO2 98 %.  ECOG PERFORMANCE STATUS: 1  Physical Exam  Constitutional: Oriented to person, place, and time and well-developed, well-nourished, and in no distress.  HENT:  Head: Normocephalic and atraumatic.  Mouth/Throat: Oropharynx is clear and moist. No oropharyngeal exudate.  Eyes: Conjunctivae are normal. Right eye exhibits no discharge. Left eye exhibits no discharge. No scleral icterus.  Neck: Normal range of motion. Neck supple.  Cardiovascular: Normal rate, regular rhythm, normal heart sounds and intact distal pulses.   Pulmonary/Chest: Effort normal and breath sounds normal. No respiratory distress.  No wheezes. No rales.  Abdominal: Soft. Bowel sounds are normal. Exhibits no distension and no mass. There is no tenderness.  Musculoskeletal: Positive for lipoma on left arm. Normal range of motion. Exhibits no edema.  Lymphadenopathy:    No cervical adenopathy.  Neurological: Alert and oriented to person, place, and time. Exhibits normal muscle tone. Gait normal. Coordination normal.  Skin: Skin is warm and dry. No rash noted. Not diaphoretic. No erythema. No pallor.  Psychiatric: Mood, memory and judgment normal.  Vitals reviewed.  LABORATORY DATA: Lab Results  Component Value Date   WBC 5.3 06/09/2022   HGB 10.6 (L) 06/09/2022   HCT 33.8 (L) 06/09/2022   MCV 86.4 06/09/2022   PLT 261 06/09/2022      Chemistry      Component Value Date/Time   NA 142 03/28/2022 0956   K 4.0 03/28/2022 0956   CL 107 03/28/2022 0956   CO2 28 03/28/2022 0956   BUN 25 (H) 03/28/2022 0956   CREATININE 1.40 (  H) 03/28/2022 0956      Component Value Date/Time   CALCIUM 9.5 03/28/2022 0956   ALKPHOS 25 (L) 03/28/2022 0956   AST 16 03/28/2022 0956   ALT 10 03/28/2022 0956   BILITOT 0.4 03/28/2022 0956       RADIOGRAPHIC STUDIES:  No results found.   ASSESSMENT/PLAN:  This is a very pleasant 72 year old African-American female with normocytic anemia likely secondary to iron deficiency anemia and B12 deficiency.  The patient receives as needed IV iron infusions with Venofer, the most recent being on 04/19/2022.  She also takes iron supplements p.o. daily.  The patient also was previously on B12 injections.  She is taking over-the-counter B12 supplements 1000.   The patient had repeat labs today with a CBC, CMP, iron studies, ferritin, and B12.  The patient's labs from today demonstrate persistent anemia with a hemoglobin of 10.6 although this is improved compared to her last appointment on 03/28/2022 which was 9.4.  Other lab studies are pending at this time.  Patient states that she was  diagnosed with rheumatoid arthritis.  She could have some component of anemia of chronic disease contributing to her blood work.  I will wait for her pending iron studies before determining if she needs additional IV iron.  If she does, I will arrange for this to be performed at the [facility] infusion center.  We will see her back for follow-up visit and repeat lab work in 2 months.  She will continue taking her oral ferrous sulfate as well as her B12 supplement.  The patient was advised to call immediately if she has any concerning symptoms in the interval. The patient voices understanding of current disease status and treatment options and is in agreement with the current care plan. All questions were answered. The patient knows to call the clinic with any problems, questions or concerns. We can certainly see the patient much sooner if necessary   Orders Placed This Encounter  Procedures   CBC with Differential ([facility] Only)    Standing Status:   Future    Standing Expiration Date:   06/10/2023   Ferritin    Standing Status:   Future    Standing Expiration Date:   06/10/2023   Iron and Iron Binding Capacity ([facility] only)    Standing Status:   Future    Standing Expiration Date:   06/10/2023   Vitamin B12    Standing Status:   Future    Standing Expiration Date:   06/10/2023      The total time spent in the appointment was 20-29 minutes.   Zollie Ellery L Markiyah Gahm, PA-C 06/09/22

## 2022-06-09 ENCOUNTER — Inpatient Hospital Stay (HOSPITAL_BASED_OUTPATIENT_CLINIC_OR_DEPARTMENT_OTHER): Payer: PPO | Admitting: Physician Assistant

## 2022-06-09 ENCOUNTER — Other Ambulatory Visit: Payer: PPO

## 2022-06-09 ENCOUNTER — Inpatient Hospital Stay: Payer: PPO | Attending: Internal Medicine

## 2022-06-09 ENCOUNTER — Other Ambulatory Visit: Payer: Self-pay

## 2022-06-09 ENCOUNTER — Ambulatory Visit: Payer: PPO | Admitting: Internal Medicine

## 2022-06-09 VITALS — BP 152/70 | HR 80 | Temp 98.3°F | Resp 15 | Wt 215.4 lb

## 2022-06-09 DIAGNOSIS — Z79899 Other long term (current) drug therapy: Secondary | ICD-10-CM | POA: Diagnosis not present

## 2022-06-09 DIAGNOSIS — E538 Deficiency of other specified B group vitamins: Secondary | ICD-10-CM | POA: Diagnosis not present

## 2022-06-09 DIAGNOSIS — D539 Nutritional anemia, unspecified: Secondary | ICD-10-CM

## 2022-06-09 DIAGNOSIS — D649 Anemia, unspecified: Secondary | ICD-10-CM | POA: Insufficient documentation

## 2022-06-09 DIAGNOSIS — R5383 Other fatigue: Secondary | ICD-10-CM | POA: Diagnosis not present

## 2022-06-09 DIAGNOSIS — M069 Rheumatoid arthritis, unspecified: Secondary | ICD-10-CM | POA: Diagnosis not present

## 2022-06-09 LAB — IRON AND IRON BINDING CAPACITY (CC-WL,HP ONLY)
Iron: 51 ug/dL (ref 28–170)
Saturation Ratios: 22 % (ref 10.4–31.8)
TIBC: 231 ug/dL — ABNORMAL LOW (ref 250–450)
UIBC: 180 ug/dL (ref 148–442)

## 2022-06-09 LAB — CBC WITH DIFFERENTIAL (CANCER CENTER ONLY)
Abs Immature Granulocytes: 0.02 10*3/uL (ref 0.00–0.07)
Basophils Absolute: 0.1 10*3/uL (ref 0.0–0.1)
Basophils Relative: 1 %
Eosinophils Absolute: 0.3 10*3/uL (ref 0.0–0.5)
Eosinophils Relative: 5 %
HCT: 33.8 % — ABNORMAL LOW (ref 36.0–46.0)
Hemoglobin: 10.6 g/dL — ABNORMAL LOW (ref 12.0–15.0)
Immature Granulocytes: 0 %
Lymphocytes Relative: 32 %
Lymphs Abs: 1.7 10*3/uL (ref 0.7–4.0)
MCH: 27.1 pg (ref 26.0–34.0)
MCHC: 31.4 g/dL (ref 30.0–36.0)
MCV: 86.4 fL (ref 80.0–100.0)
Monocytes Absolute: 0.5 10*3/uL (ref 0.1–1.0)
Monocytes Relative: 9 %
Neutro Abs: 2.8 10*3/uL (ref 1.7–7.7)
Neutrophils Relative %: 53 %
Platelet Count: 261 10*3/uL (ref 150–400)
RBC: 3.91 MIL/uL (ref 3.87–5.11)
RDW: 16.9 % — ABNORMAL HIGH (ref 11.5–15.5)
WBC Count: 5.3 10*3/uL (ref 4.0–10.5)
nRBC: 0 % (ref 0.0–0.2)

## 2022-06-09 LAB — VITAMIN B12: Vitamin B-12: 1263 pg/mL — ABNORMAL HIGH (ref 180–914)

## 2022-06-09 LAB — FERRITIN: Ferritin: 844 ng/mL — ABNORMAL HIGH (ref 11–307)

## 2022-06-09 NOTE — Addendum Note (Signed)
Addended by: Estella Husk on: 06/09/2022 03:04 PM   Modules accepted: Orders

## 2022-06-10 ENCOUNTER — Telehealth: Payer: Self-pay | Admitting: Physician Assistant

## 2022-06-10 NOTE — Telephone Encounter (Signed)
I reviewed the patient's lab work with Dr. Julien Nordmann.  No need for an iron infusion at this time.  We will see her back in 2 months with repeat labs.

## 2022-07-21 ENCOUNTER — Ambulatory Visit: Payer: PPO

## 2022-08-08 ENCOUNTER — Inpatient Hospital Stay: Payer: PPO | Admitting: Internal Medicine

## 2022-08-08 ENCOUNTER — Inpatient Hospital Stay: Payer: PPO | Attending: Internal Medicine

## 2022-09-01 ENCOUNTER — Ambulatory Visit
Admission: RE | Admit: 2022-09-01 | Discharge: 2022-09-01 | Disposition: A | Discharge status: Home / Self Care | Payer: PPO | Source: Ambulatory Visit | Attending: Nurse Practitioner | Admitting: Nurse Practitioner

## 2022-09-01 DIAGNOSIS — Z1231 Encounter for screening mammogram for malignant neoplasm of breast: Secondary | ICD-10-CM

## 2022-09-14 ENCOUNTER — Telehealth: Payer: Self-pay | Admitting: Internal Medicine

## 2022-09-20 ENCOUNTER — Inpatient Hospital Stay: Payer: PPO | Attending: Internal Medicine | Admitting: Physician Assistant

## 2022-09-20 ENCOUNTER — Inpatient Hospital Stay (HOSPITAL_BASED_OUTPATIENT_CLINIC_OR_DEPARTMENT_OTHER): Payer: PPO | Admitting: Internal Medicine

## 2022-09-20 ENCOUNTER — Other Ambulatory Visit: Payer: Self-pay

## 2022-09-20 VITALS — BP 141/62 | HR 87 | Temp 97.6°F | Resp 16 | Wt 220.1 lb

## 2022-09-20 DIAGNOSIS — Z880 Allergy status to penicillin: Secondary | ICD-10-CM | POA: Diagnosis not present

## 2022-09-20 DIAGNOSIS — D519 Vitamin B12 deficiency anemia, unspecified: Secondary | ICD-10-CM | POA: Insufficient documentation

## 2022-09-20 DIAGNOSIS — Z90710 Acquired absence of both cervix and uterus: Secondary | ICD-10-CM | POA: Diagnosis not present

## 2022-09-20 DIAGNOSIS — Z79899 Other long term (current) drug therapy: Secondary | ICD-10-CM | POA: Diagnosis not present

## 2022-09-20 DIAGNOSIS — D509 Iron deficiency anemia, unspecified: Secondary | ICD-10-CM | POA: Insufficient documentation

## 2022-09-20 DIAGNOSIS — E538 Deficiency of other specified B group vitamins: Secondary | ICD-10-CM

## 2022-09-20 DIAGNOSIS — R9230 Dense breasts, unspecified: Secondary | ICD-10-CM | POA: Diagnosis not present

## 2022-09-20 DIAGNOSIS — Z881 Allergy status to other antibiotic agents status: Secondary | ICD-10-CM | POA: Insufficient documentation

## 2022-09-20 DIAGNOSIS — Z888 Allergy status to other drugs, medicaments and biological substances status: Secondary | ICD-10-CM | POA: Insufficient documentation

## 2022-09-20 DIAGNOSIS — R5383 Other fatigue: Secondary | ICD-10-CM | POA: Insufficient documentation

## 2022-09-20 DIAGNOSIS — D539 Nutritional anemia, unspecified: Secondary | ICD-10-CM

## 2022-09-20 DIAGNOSIS — M2550 Pain in unspecified joint: Secondary | ICD-10-CM | POA: Diagnosis not present

## 2022-09-20 LAB — CBC WITH DIFFERENTIAL (CANCER CENTER ONLY)
Abs Immature Granulocytes: 0.01 10*3/uL (ref 0.00–0.07)
Basophils Absolute: 0.1 10*3/uL (ref 0.0–0.1)
Basophils Relative: 2 %
Eosinophils Absolute: 0.2 10*3/uL (ref 0.0–0.5)
Eosinophils Relative: 5 %
HCT: 31.9 % — ABNORMAL LOW (ref 36.0–46.0)
Hemoglobin: 9.8 g/dL — ABNORMAL LOW (ref 12.0–15.0)
Immature Granulocytes: 0 %
Lymphocytes Relative: 41 %
Lymphs Abs: 1.8 10*3/uL (ref 0.7–4.0)
MCH: 26.6 pg (ref 26.0–34.0)
MCHC: 30.7 g/dL (ref 30.0–36.0)
MCV: 86.4 fL (ref 80.0–100.0)
Monocytes Absolute: 0.6 10*3/uL (ref 0.1–1.0)
Monocytes Relative: 13 %
Neutro Abs: 1.7 10*3/uL (ref 1.7–7.7)
Neutrophils Relative %: 39 %
Platelet Count: 232 10*3/uL (ref 150–400)
RBC: 3.69 MIL/uL — ABNORMAL LOW (ref 3.87–5.11)
RDW: 16.7 % — ABNORMAL HIGH (ref 11.5–15.5)
WBC Count: 4.4 10*3/uL (ref 4.0–10.5)
nRBC: 0 % (ref 0.0–0.2)

## 2022-09-20 LAB — FERRITIN: Ferritin: 607 ng/mL — ABNORMAL HIGH (ref 11–307)

## 2022-09-20 LAB — IRON AND IRON BINDING CAPACITY (CC-WL,HP ONLY)
Iron: 53 ug/dL (ref 28–170)
Saturation Ratios: 22 % (ref 10.4–31.8)
TIBC: 242 ug/dL — ABNORMAL LOW (ref 250–450)
UIBC: 189 ug/dL (ref 148–442)

## 2022-09-20 LAB — VITAMIN B12: Vitamin B-12: 930 pg/mL — ABNORMAL HIGH (ref 180–914)

## 2022-09-20 NOTE — Progress Notes (Signed)
[facility] [phone]   [phone]  OFFICE PROGRESS NOTE  [name], NP [address] [place] [address]  DIAGNOSIS: Normocytic anemia with a combination of iron deficiency as well as vitamin B12 deficiency.  PRIOR THERAPY: She is status post treatment with Venofer 200 Mg IV weekly for 4 doses in addition to vitamin B12 injections.  CURRENT THERAPY: Ferrous sulfate 325 mg p.o. daily.  INTERVAL HISTORY: Meredith Cline 72 y.o. female returns to the neck today for follow-up visit.  The patient is feeling fine today with no concerning complaints except for fatigue.  She denied having any chest pain, shortness of breath, cough or hemoptysis.  She has no nausea, vomiting, diarrhea or constipation.  She continues to have arthralgia and she is expected to have knee replacement after clearance from hematology.  She has no recent weight loss or night sweats.  She is here today for evaluation and repeat blood work.    MEDICAL HISTORY: Past Medical History:  Diagnosis Date   Anemia    Arthritis    Diabetes mellitus without complication    GERD (gastroesophageal reflux disease)    Hypercholesterolemia    Hypertension    Sleep apnea     ALLERGIES:  is allergic to gabapentin, clindamycin, and penicillins.  MEDICATIONS:  Current Outpatient Medications  Medication Sig Dispense Refill   amLODipine-valsartan (EXFORGE) 10-320 MG tablet Take 1 tablet by mouth daily.     carvedilol (COREG) 25 MG tablet Take 25 mg by mouth 2 (two) times daily.     ferrous sulfate 325 (65 FE) MG tablet Take 325 mg by mouth daily with breakfast.     fluticasone (FLONASE) 50 MCG/ACT nasal spray Place 2 sprays into both nostrils daily as needed for allergies or rhinitis.     leflunomide (ARAVA) 20 MG tablet Take 20 mg by mouth daily.     metFORMIN (GLUCOPHAGE) 500 MG tablet Take 500 mg by mouth 2 (two) times daily with a meal.     Multiple Vitamin  (MULTI-VITAMIN) tablet Take 1 tablet by mouth in the morning and at bedtime.     Omega-3 Fatty Acids (FISH OIL) 1000 MG CAPS Take 1,000 mg by mouth in the morning and at bedtime.     omeprazole (PRILOSEC) 20 MG capsule Take 20 mg by mouth daily.     pregabalin (LYRICA) 75 MG capsule Take 75 mg by mouth 2 (two) times daily.     simvastatin (ZOCOR) 40 MG tablet Take 40 mg by mouth daily.     torsemide (DEMADEX) 10 MG tablet Take 10 mg by mouth daily.     No current facility-administered medications for this visit.    SURGICAL HISTORY:  Past Surgical History:  Procedure Laterality Date   ABDOMINAL HYSTERECTOMY     BREAST REDUCTION SURGERY     CARPAL TUNNEL RELEASE     HERNIA REPAIR     NASAL SEPTUM SURGERY     REDUCTION MAMMAPLASTY     app. 1980    REVIEW OF SYSTEMS:  A comprehensive review of systems was negative except for: Constitutional: positive for fatigue Musculoskeletal: positive for arthralgias   PHYSICAL EXAMINATION: General appearance: alert, cooperative, fatigued, and no distress Head: Normocephalic, without obvious abnormality, atraumatic Neck: no adenopathy, no JVD, supple, symmetrical, trachea midline, and thyroid not enlarged, symmetric, no tenderness/mass/nodules Lymph nodes: Cervical, supraclavicular, and axillary nodes normal. Resp: clear to auscultation bilaterally Back: symmetric, no curvature. ROM normal. No CVA tenderness. Cardio:  regular rate and rhythm, S1, S2 normal, no murmur, click, rub or gallop GI: soft, non-tender; bowel sounds normal; no masses,  no organomegaly Extremities: extremities normal, atraumatic, no cyanosis or edema  ECOG PERFORMANCE STATUS: 1 - Symptomatic but completely ambulatory  Blood pressure (!) 141/62, pulse 87, temperature 97.6 F (36.4 C), temperature source Temporal, resp. rate 16, weight 220 lb 1.6 oz (99.8 kg), SpO2 92 %.  LABORATORY DATA: Lab Results  Component Value Date   WBC 4.4 09/20/2022   HGB 9.8 (L) 09/20/2022    HCT 31.9 (L) 09/20/2022   MCV 86.4 09/20/2022   PLT 232 09/20/2022      Chemistry      Component Value Date/Time   NA 142 03/28/2022 0956   K 4.0 03/28/2022 0956   CL 107 03/28/2022 0956   CO2 28 03/28/2022 0956   BUN 25 (H) 03/28/2022 0956   CREATININE 1.40 (H) 03/28/2022 0956      Component Value Date/Time   CALCIUM 9.5 03/28/2022 0956   ALKPHOS 25 (L) 03/28/2022 0956   AST 16 03/28/2022 0956   ALT 10 03/28/2022 0956   BILITOT 0.4 03/28/2022 0956       RADIOGRAPHIC STUDIES: MM 3D SCREEN BREAST BILATERAL  Result Date: 09/05/2022 CLINICAL DATA:  Screening. EXAM: DIGITAL SCREENING BILATERAL MAMMOGRAM WITH TOMOSYNTHESIS AND CAD TECHNIQUE: Bilateral screening digital craniocaudal and mediolateral oblique mammograms were obtained. Bilateral screening digital breast tomosynthesis was performed. The images were evaluated with computer-aided detection. COMPARISON:  Previous exam(s). ACR Breast Density Category b: There are scattered areas of fibroglandular density. FINDINGS: There are no findings suspicious for malignancy. IMPRESSION: No mammographic evidence of malignancy. A result letter of this screening mammogram will be mailed directly to the patient. RECOMMENDATION: Screening mammogram in one year. (Code:[id number]) BI-RADS CATEGORY  1: Negative. Electronically Signed   By: Sande Brothers M.D.   On: 09/05/2022 08:34    ASSESSMENT AND PLAN: This is a very pleasant 72 years old African-American female with normocytic anemia likely secondary to mixture of iron deficiency as well as vitamin B12 deficiency.  The patient was treated with iron infusion with Venofer in addition to weekly vitamin B12 injection. The patient is doing fine with no concerning complaints except for mild fatigue but she is supposed to have knee surgery and her hemoglobin is still on the low side.  She was referred back for consideration of IV iron infusion. She received iron infusion with Venofer but there is no  improvement in her anemia. I had a lengthy discussion with the patient today about her condition which could be anemia of chronic disease secondary to rheumatoid arthritis. I discussed with her proceeding with a bone marrow biopsy and aspirate to rule out any myelodysplastic syndrome and the patient is in agreement with the plan. I will see her back for follow-up visit in around 4 weeks for evaluation with repeat blood work including copper and ceruloplasmin as well as blood for heavy metals. The patient was advised to call immediately if she has any other concerning symptoms in the interval. The patient voices understanding of current disease status and treatment options and is in agreement with the current care plan. All questions were answered. The patient knows to call the clinic with any problems, questions or concerns. We can certainly see the patient much sooner if necessary.  The total time spent in the appointment was 20 minutes.  Disclaimer: This note was dictated with voice recognition software. Similar sounding words can inadvertently be transcribed and  may not be corrected upon review.

## 2022-09-21 ENCOUNTER — Encounter: Payer: Self-pay | Admitting: Physician Assistant

## 2022-09-21 NOTE — Progress Notes (Signed)
Opened in error

## 2022-09-21 NOTE — Progress Notes (Deleted)
Opened in error

## 2022-09-22 ENCOUNTER — Telehealth: Payer: Self-pay | Admitting: Internal Medicine

## 2022-09-22 NOTE — Telephone Encounter (Signed)
Called patient regarding upcoming May appointments, patient is notified.  ?

## 2022-09-23 ENCOUNTER — Telehealth: Payer: Self-pay | Admitting: Internal Medicine

## 2022-09-23 NOTE — Telephone Encounter (Signed)
Scheduled per 04/24 los, patient has been called and notified of upcoming appointments.  

## 2022-10-03 ENCOUNTER — Other Ambulatory Visit: Payer: Self-pay | Admitting: Radiology

## 2022-10-03 DIAGNOSIS — D539 Nutritional anemia, unspecified: Secondary | ICD-10-CM

## 2022-10-03 NOTE — Consult Note (Signed)
Chief Complaint: Patient was seen in consultation today for image guided bone marrow biopsy    Referring Physician(s): Mohamed,Mohamed  Supervising Physician: Simonne Come  Patient Status: [facility] - Out-pt  History of Present Illness: Meredith Cline is a 72 y.o. female with PMH sig for arthritis, DM, GERD, HLD, HTN, sleep apnea who presents now with persistent normocytic anemia of uncertain etiology. She is scheduled today for image guided bone marrow biopsy for further evaluation.   Past Medical History:  Diagnosis Date   Anemia    Arthritis    Diabetes mellitus without complication (HCC)    GERD (gastroesophageal reflux disease)    Hypercholesterolemia    Hypertension    Sleep apnea     Past Surgical History:  Procedure Laterality Date   ABDOMINAL HYSTERECTOMY     BREAST REDUCTION SURGERY     CARPAL TUNNEL RELEASE     HERNIA REPAIR     NASAL SEPTUM SURGERY     REDUCTION MAMMAPLASTY     app. 1980    Allergies: Gabapentin, Clindamycin, and Penicillins  Medications: Prior to Admission medications   Medication Sig Start Date End Date Taking? Authorizing Provider  amLODipine-valsartan (EXFORGE) 10-320 MG tablet Take 1 tablet by mouth daily. 12/15/21   [provider]  carvedilol (COREG) 25 MG tablet Take 25 mg by mouth 2 (two) times daily. 02/15/22   [provider]  ferrous sulfate 325 (65 FE) MG tablet Take 325 mg by mouth daily with breakfast.    [provider]  fluticasone (FLONASE) 50 MCG/ACT nasal spray Place 2 sprays into both nostrils daily as needed for allergies or rhinitis.    [provider]  leflunomide (ARAVA) 20 MG tablet Take 20 mg by mouth daily. 08/08/19   [provider]  metFORMIN (GLUCOPHAGE) 500 MG tablet Take 500 mg by mouth 2 (two) times daily with a meal.    [provider]  Multiple Vitamin (MULTI-VITAMIN) tablet Take 1 tablet by mouth in the morning and at bedtime.    [provider]   Omega-3 Fatty Acids (FISH OIL) 1000 MG CAPS Take 1,000 mg by mouth in the morning and at bedtime.    [provider]  omeprazole (PRILOSEC) 20 MG capsule Take 20 mg by mouth daily.    [provider]  pregabalin (LYRICA) 75 MG capsule Take 75 mg by mouth 2 (two) times daily. 08/08/20   [provider]  simvastatin (ZOCOR) 40 MG tablet Take 40 mg by mouth daily.    [provider]  torsemide (DEMADEX) 10 MG tablet Take 10 mg by mouth daily. 02/15/22   [provider]     Family History  Problem Relation Age of Onset   Adrenal disorder Neg Hx     Social History   Socioeconomic History   Marital status: Widowed    Spouse name: Not on file   Number of children: Not on file   Years of education: Not on file   Highest education level: Not on file  Occupational History   Not on file  Tobacco Use   Smoking status: Never   Smokeless tobacco: Never  Vaping Use   Vaping Use: Never used  Substance and Sexual Activity   Alcohol use: No   Drug use: Never   Sexual activity: Not on file  Other Topics Concern   Not on file  Social History Narrative   Not on file   Social Determinants of Health   Financial Resource Strain:  Not on file  Food Insecurity: Not on file  Transportation Needs: Not on file  Physical Activity: Not on file  Stress: Not on file  Social Connections: Not on file      Review of Systems denies fever,HA,CP,dyspnea, cough, abd pain, N/V or bleeding; she does have low back pain  Vital Signs: Vitals:   10/04/22 0701  BP: (!) 148/68  Pulse: 70  Resp: 16  Temp: 98.7 F (37.1 C)  SpO2: 92%      Code Status: FULL CODE   Physical Exam: awake/alert; chest- CTA bilat; heart- nl rate, occ pauses/ectopy; abd- soft,+BS,NT; bilat pretibial edema noted  Imaging: No results found.  Labs:  CBC: Recent Labs    03/15/22 1107 03/28/22 0956 06/09/22 1314 09/20/22 0911  WBC 4.4 4.1 5.3 4.4  HGB 9.4* 9.4* 10.6*  9.8*  HCT 31.2* 29.9* 33.8* 31.9*  PLT 239 229 261 232    COAGS: No results for input(s): "INR", "APTT" in the last 8760 hours.  BMP: Recent Labs    03/15/22 1107 03/28/22 0956  NA 143 142  K 4.2 4.0  CL 109 107  CO2 27 28  GLUCOSE 120* 174*  BUN 25* 25*  CALCIUM 9.4 9.5  CREATININE 1.27* 1.40*  GFRNONAA 45* 40*    LIVER FUNCTION TESTS: Recent Labs    03/28/22 0956  BILITOT 0.4  AST 16  ALT 10  ALKPHOS 25*  PROT 7.4  ALBUMIN 3.9    TUMOR MARKERS: No results for input(s): "AFPTM", "CEA", "CA199", "CHROMGRNA" in the last 8760 hours.  Assessment and Plan: 72 y.o. female with PMH sig for arthritis, DM, GERD, HLD, HTN, sleep apnea who presents now with persistent normocytic anemia of uncertain etiology. She is scheduled today for image guided bone marrow biopsy for further evaluation. Risks and benefits of procedure was discussed with the patient/son including, but not limited to bleeding, infection, damage to adjacent structures or low yield requiring additional tests.  All of the questions were answered and there is agreement to proceed.  Consent signed and in chart.    Thank you for this interesting consult.  I greatly enjoyed meeting Meredith Cline and look forward to participating in their care.  A copy of this report was sent to the requesting provider on this date.  Electronically Signed: D. Jeananne Rama, PA-C 10/03/2022, 2:39 PM   I spent a total of 20 minutes   in face to face in clinical consultation, greater than 50% of which was counseling/coordinating care for image guided bone marrow biopsy

## 2022-10-04 ENCOUNTER — Ambulatory Visit (HOSPITAL_COMMUNITY)
Admission: RE | Admit: 2022-10-04 | Discharge: 2022-10-04 | Disposition: A | Discharge status: Home / Self Care | Payer: PPO | Source: Ambulatory Visit | Attending: Internal Medicine | Admitting: Internal Medicine

## 2022-10-04 ENCOUNTER — Encounter (HOSPITAL_COMMUNITY): Payer: Self-pay

## 2022-10-04 DIAGNOSIS — D539 Nutritional anemia, unspecified: Secondary | ICD-10-CM

## 2022-10-04 DIAGNOSIS — D649 Anemia, unspecified: Secondary | ICD-10-CM | POA: Insufficient documentation

## 2022-10-04 LAB — CBC WITH DIFFERENTIAL/PLATELET
Abs Immature Granulocytes: 0.01 10*3/uL (ref 0.00–0.07)
Basophils Absolute: 0.1 10*3/uL (ref 0.0–0.1)
Basophils Relative: 1 %
Eosinophils Absolute: 0.2 10*3/uL (ref 0.0–0.5)
Eosinophils Relative: 3 %
HCT: 31.7 % — ABNORMAL LOW (ref 36.0–46.0)
Hemoglobin: 9.9 g/dL — ABNORMAL LOW (ref 12.0–15.0)
Immature Granulocytes: 0 %
Lymphocytes Relative: 29 %
Lymphs Abs: 1.6 10*3/uL (ref 0.7–4.0)
MCH: 26.8 pg (ref 26.0–34.0)
MCHC: 31.2 g/dL (ref 30.0–36.0)
MCV: 85.7 fL (ref 80.0–100.0)
Monocytes Absolute: 0.7 10*3/uL (ref 0.1–1.0)
Monocytes Relative: 12 %
Neutro Abs: 3.1 10*3/uL (ref 1.7–7.7)
Neutrophils Relative %: 55 %
Platelets: 255 10*3/uL (ref 150–400)
RBC: 3.7 MIL/uL — ABNORMAL LOW (ref 3.87–5.11)
RDW: 17.1 % — ABNORMAL HIGH (ref 11.5–15.5)
WBC: 5.6 10*3/uL (ref 4.0–10.5)
nRBC: 0 % (ref 0.0–0.2)

## 2022-10-04 LAB — GLUCOSE, CAPILLARY: Glucose-Capillary: 86 mg/dL (ref 70–99)

## 2022-10-04 MED ORDER — NALOXONE HCL 0.4 MG/ML IJ SOLN
INTRAMUSCULAR | Status: AC
  Filled 2022-10-04: qty 1

## 2022-10-04 MED ORDER — FENTANYL CITRATE (PF) 100 MCG/2ML IJ SOLN
INTRAMUSCULAR | Status: AC | PRN
  Administered 2022-10-04 (×2): 50 ug via INTRAVENOUS

## 2022-10-04 MED ORDER — FENTANYL CITRATE (PF) 100 MCG/2ML IJ SOLN
INTRAMUSCULAR | Status: AC
  Filled 2022-10-04: qty 4

## 2022-10-04 MED ORDER — FLUMAZENIL 0.5 MG/5ML IV SOLN
INTRAVENOUS | Status: AC
  Filled 2022-10-04: qty 5

## 2022-10-04 MED ORDER — SODIUM CHLORIDE 0.9 % IV SOLN: INTRAVENOUS | Status: DC

## 2022-10-04 MED ORDER — LIDOCAINE HCL (PF) 1 % IJ SOLN
INTRAMUSCULAR | Status: AC | PRN
  Administered 2022-10-04: 10 mL

## 2022-10-04 MED ORDER — MIDAZOLAM HCL 2 MG/2ML IJ SOLN
INTRAMUSCULAR | Status: AC | PRN
  Administered 2022-10-04 (×2): 1 mg via INTRAVENOUS

## 2022-10-04 MED ORDER — MIDAZOLAM HCL 2 MG/2ML IJ SOLN
INTRAMUSCULAR | Status: AC
  Filled 2022-10-04: qty 4

## 2022-10-04 NOTE — Sedation Documentation (Signed)
Sample #2 obtained 

## 2022-10-04 NOTE — Sedation Documentation (Signed)
Sample #1 obtained 

## 2022-10-04 NOTE — Discharge Instructions (Signed)
Bone Marrow Aspiration and Bone Marrow Biopsy, Adult, Care After  The following information offers guidance on how to care for yourself after your procedure. Your health care provider may also give you more specific instructions. If you have problems or questions, contact your health care provider.  What can I expect after the procedure?  May remove dressing or bandaid and shower tomorrow.  Keep site clean and dry. Replace with clean dressing or bandaid as necessary. Urgent needs IR clinic [phone] (mon-fri 8-5).  After the procedure, it is common to have: Mild pain and tenderness. Swelling. Bruising. Follow these instructions at home: Incision care  Follow instructions from your health care provider about how to take care of the incision site. Make sure you: Wash your hands with soap and water for at least 20 seconds before and after you change your bandage (dressing). If soap and water are not available, use hand sanitizer. Change your dressing as told by your health care provider. Leave stitches (sutures), skin glue, or adhesive strips in place. These skin closures may need to stay in place for 2 weeks or longer. If adhesive strip edges start to loosen and curl up, you may trim the loose edges. Do not remove adhesive strips completely unless your health care provider tells you to do that. Check your incision site every day for signs of infection. Check for: More redness, swelling, or pain. Fluid or blood. Warmth. Pus or a bad smell. Activity Return to your normal activities as told by your health care provider. Ask your health care provider what activities are safe for you. Do not lift anything that is heavier than 10 lb (4.5 kg), or the limit that you are told, until your health care provider says that it is safe. If you were given a sedative during the procedure, it can affect you for  several hours. Do not drive or operate machinery until your health care provider says that it is safe. General instructions  Take over-the-counter and prescription medicines only as told by your health care provider. Do not take baths, swim, or use a hot tub until your health care provider approves. Ask your health care provider if you may take showers. You may only be allowed to take sponge baths. If directed, put ice on the affected area. To do this: Put ice in a plastic bag. Place a towel between your skin and the bag. Leave the ice on for 20 minutes, 2-3 times a day. If your skin turns bright red, remove the ice right away to prevent skin damage. The risk of skin damage is higher if you cannot feel pain, heat, or cold. Contact a health care provider if: You have signs of infection. Your pain is not controlled with medicine. You have cancer, and a temperature of 100.4F (38C) or higher. Get help right away if: You have a temperature of 101F (38.3C) or higher, or as told by your health care provider. You have bleeding from the incision site that cannot be controlled. This information is not intended to replace advice given to you by your health care provider. Make sure you discuss any questions you have with your health care provider. Document Revised: 09/20/2021 Document Reviewed: 09/20/2021 Elsevier Patient Education  2023 Elsevier Inc.                            Moderate Conscious Sedation, Adult, Care After  This sheet gives you information about how to care   for yourself after your procedure. Your health care provider may also give you more specific instructions. If you have problems or questions, contact your health care provider. What can I expect after the procedure? After the procedure, it is common to have: Sleepiness for several hours. Impaired judgment for several hours. Difficulty with balance. Vomiting if you eat too soon. Follow these instructions at home: For  the time period you were told by your health care provider:     Rest. Do not participate in activities where you could fall or become injured. Do not drive or use machinery. Do not drink alcohol. Do not take sleeping pills or medicines that cause drowsiness. Do not make important decisions or sign legal documents. Do not take care of children on your own. Eating and drinking  Follow the diet recommended by your health care provider. Drink enough fluid to keep your urine pale yellow. If you vomit: Drink water, juice, or soup when you can drink without vomiting. Make sure you have little or no nausea before eating solid foods. General instructions Take over-the-counter and prescription medicines only as told by your health care provider. Have a responsible adult stay with you for the time you are told. It is important to have someone help care for you until you are awake and alert. Do not smoke. Keep all follow-up visits as told by your health care provider. This is important. Contact a health care provider if: You are still sleepy or having trouble with balance after 24 hours. You feel light-headed. You keep feeling nauseous or you keep vomiting. You develop a rash. You have a fever. You have redness or swelling around the IV site. Get help right away if: You have trouble breathing. You have new-onset confusion at home. Summary After the procedure, it is common to feel sleepy, have impaired judgment, or feel nauseous if you eat too soon. Rest after you get home. Know the things you should not do after the procedure. Follow the diet recommended by your health care provider and drink enough fluid to keep your urine pale yellow. Get help right away if you have trouble breathing or new-onset confusion at home. This information is not intended to replace advice given to you by your health care provider. Make sure you discuss any questions you have with your health care  provider. Document Revised: 09/13/2019 Document Reviewed: 04/11/2019 Elsevier Patient Education  2023 Elsevier Inc.  

## 2022-10-04 NOTE — Procedures (Signed)
Pre-procedure Diagnosis: Persistent normocytic anemia of uncertain etiology  Post-procedure Diagnosis: Same  Technically successful CT guided bone marrow aspiration and biopsy of left iliac crest.   Complications: None Immediate EBL: Trace  Signed: Simonne Come Pager: [phone] 10/04/2022, 9:38 AM

## 2022-10-06 LAB — SURGICAL PATHOLOGY

## 2022-10-13 ENCOUNTER — Encounter (HOSPITAL_COMMUNITY): Payer: Self-pay | Admitting: Internal Medicine

## 2022-10-18 ENCOUNTER — Other Ambulatory Visit: Payer: Self-pay

## 2022-10-18 ENCOUNTER — Inpatient Hospital Stay (HOSPITAL_BASED_OUTPATIENT_CLINIC_OR_DEPARTMENT_OTHER): Payer: PPO | Admitting: Internal Medicine

## 2022-10-18 ENCOUNTER — Inpatient Hospital Stay: Payer: PPO | Attending: Internal Medicine

## 2022-10-18 VITALS — BP 145/70 | HR 88 | Temp 97.3°F | Resp 16 | Wt 213.5 lb

## 2022-10-18 DIAGNOSIS — D649 Anemia, unspecified: Secondary | ICD-10-CM | POA: Diagnosis not present

## 2022-10-18 DIAGNOSIS — D539 Nutritional anemia, unspecified: Secondary | ICD-10-CM

## 2022-10-18 DIAGNOSIS — E538 Deficiency of other specified B group vitamins: Secondary | ICD-10-CM | POA: Diagnosis present

## 2022-10-18 LAB — CBC WITH DIFFERENTIAL (CANCER CENTER ONLY)
Abs Immature Granulocytes: 0.01 10*3/uL (ref 0.00–0.07)
Basophils Absolute: 0.1 10*3/uL (ref 0.0–0.1)
Basophils Relative: 1 %
Eosinophils Absolute: 0.2 10*3/uL (ref 0.0–0.5)
Eosinophils Relative: 4 %
HCT: 33.2 % — ABNORMAL LOW (ref 36.0–46.0)
Hemoglobin: 10.2 g/dL — ABNORMAL LOW (ref 12.0–15.0)
Immature Granulocytes: 0 %
Lymphocytes Relative: 29 %
Lymphs Abs: 1.5 10*3/uL (ref 0.7–4.0)
MCH: 26.2 pg (ref 26.0–34.0)
MCHC: 30.7 g/dL (ref 30.0–36.0)
MCV: 85.3 fL (ref 80.0–100.0)
Monocytes Absolute: 0.6 10*3/uL (ref 0.1–1.0)
Monocytes Relative: 11 %
Neutro Abs: 2.9 10*3/uL (ref 1.7–7.7)
Neutrophils Relative %: 55 %
Platelet Count: 262 10*3/uL (ref 150–400)
RBC: 3.89 MIL/uL (ref 3.87–5.11)
RDW: 17.2 % — ABNORMAL HIGH (ref 11.5–15.5)
WBC Count: 5.2 10*3/uL (ref 4.0–10.5)
nRBC: 0 % (ref 0.0–0.2)

## 2022-10-18 NOTE — Progress Notes (Signed)
[facility] [phone]   [phone]  OFFICE PROGRESS NOTE  [name], NP [address] [place] [address]  DIAGNOSIS: Normocytic anemia with a combination of iron deficiency as well as vitamin B12 deficiency.  PRIOR THERAPY: She is status post treatment with Venofer 200 Mg IV weekly for 4 doses in addition to vitamin B12 injections.  CURRENT THERAPY: Ferrous sulfate 325 mg p.o. daily.  INTERVAL HISTORY: Meredith Cline 72 y.o. female returns to the clinic today for follow-up visit.  The patient is feeling fine today with no concerning complaints except for the mild fatigue and arthralgia of her knees.  She denied having any current chest pain, shortness of breath, cough or hemoptysis.  She has no nausea, vomiting, diarrhea or constipation.  She has no headache or visual changes.  She had a bone marrow biopsy and aspirate performed recently and she is here for evaluation and discussion of her biopsy results and recommendation regarding her condition.  MEDICAL HISTORY: Past Medical History:  Diagnosis Date   Anemia    Arthritis    Diabetes mellitus without complication (HCC)    GERD (gastroesophageal reflux disease)    Hypercholesterolemia    Hypertension    Sleep apnea     ALLERGIES:  is allergic to gabapentin, clindamycin, and penicillins.  MEDICATIONS:  Current Outpatient Medications  Medication Sig Dispense Refill   amLODipine-valsartan (EXFORGE) 10-320 MG tablet Take 1 tablet by mouth daily.     carvedilol (COREG) 25 MG tablet Take 25 mg by mouth 2 (two) times daily.     cholecalciferol (VITAMIN D3) 25 MCG (1000 UNIT) tablet Take 1,000 Units by mouth daily.     ferrous sulfate 325 (65 FE) MG tablet Take 325 mg by mouth daily with breakfast.     fluticasone (FLONASE) 50 MCG/ACT nasal spray Place 2 sprays into both nostrils daily as needed for allergies or rhinitis.     leflunomide (ARAVA) 20 MG tablet Take 20 mg by mouth  daily.     metFORMIN (GLUCOPHAGE) 500 MG tablet Take 500 mg by mouth 2 (two) times daily with a meal.     Multiple Vitamin (MULTI-VITAMIN) tablet Take 1 tablet by mouth in the morning and at bedtime.     Omega-3 Fatty Acids (FISH OIL) 1000 MG CAPS Take 1,000 mg by mouth in the morning and at bedtime.     omeprazole (PRILOSEC) 20 MG capsule Take 20 mg by mouth daily.     pregabalin (LYRICA) 75 MG capsule Take 75 mg by mouth 2 (two) times daily.     simvastatin (ZOCOR) 40 MG tablet Take 40 mg by mouth daily.     torsemide (DEMADEX) 10 MG tablet Take 10 mg by mouth daily.     vitamin B-12 (CYANOCOBALAMIN) 100 MCG tablet Take 100 mcg by mouth daily.     No current facility-administered medications for this visit.    SURGICAL HISTORY:  Past Surgical History:  Procedure Laterality Date   ABDOMINAL HYSTERECTOMY     BREAST REDUCTION SURGERY     CARPAL TUNNEL RELEASE     HERNIA REPAIR     NASAL SEPTUM SURGERY     REDUCTION MAMMAPLASTY     app. 1980    REVIEW OF SYSTEMS:  Constitutional: positive for fatigue Eyes: negative Ears, nose, mouth, throat, and face: negative Respiratory: negative Cardiovascular: negative Gastrointestinal: negative Genitourinary:negative Integument/breast: negative Hematologic/lymphatic: negative Musculoskeletal:positive for arthralgias Neurological: negative Behavioral/Psych: negative Endocrine: negative Allergic/Immunologic: negative  PHYSICAL EXAMINATION: General appearance: alert, cooperative, fatigued, and no distress Head: Normocephalic, without obvious abnormality, atraumatic Neck: no adenopathy, no JVD, supple, symmetrical, trachea midline, and thyroid not enlarged, symmetric, no tenderness/mass/nodules Lymph nodes: Cervical, supraclavicular, and axillary nodes normal. Resp: clear to auscultation bilaterally Back: symmetric, no curvature. ROM normal. No CVA tenderness. Cardio: regular rate and rhythm, S1, S2 normal, no murmur, click, rub or  gallop GI: soft, non-tender; bowel sounds normal; no masses,  no organomegaly Extremities: extremities normal, atraumatic, no cyanosis or edema Neurologic: Alert and oriented X 3, normal strength and tone. Normal symmetric reflexes. Normal coordination and gait  ECOG PERFORMANCE STATUS: 1 - Symptomatic but completely ambulatory  Blood pressure (!) 145/70, pulse 88, temperature (!) 97.3 F (36.3 C), temperature source Temporal, resp. rate 16, weight 213 lb 8 oz (96.8 kg), SpO2 92 %.  LABORATORY DATA: Lab Results  Component Value Date   WBC 5.2 10/18/2022   HGB 10.2 (L) 10/18/2022   HCT 33.2 (L) 10/18/2022   MCV 85.3 10/18/2022   PLT 262 10/18/2022      Chemistry      Component Value Date/Time   NA 142 03/28/2022 0956   K 4.0 03/28/2022 0956   CL 107 03/28/2022 0956   CO2 28 03/28/2022 0956   BUN 25 (H) 03/28/2022 0956   CREATININE 1.40 (H) 03/28/2022 0956      Component Value Date/Time   CALCIUM 9.5 03/28/2022 0956   ALKPHOS 25 (L) 03/28/2022 0956   AST 16 03/28/2022 0956   ALT 10 03/28/2022 0956   BILITOT 0.4 03/28/2022 0956       RADIOGRAPHIC STUDIES: CT BONE MARROW BIOPSY & ASPIRATION  Result Date: 10/04/2022 INDICATION: Persistent anemia of uncertain etiology. Please perform CT-guided bone marrow biopsy tissue diagnostic purposes. EXAM: CT-GUIDED BONE MARROW BIOPSY AND ASPIRATION MEDICATIONS: None ANESTHESIA/SEDATION: Moderate (conscious) sedation was employed during this procedure as administered by the Interventional Radiology RN. A total of Versed 3 mg and Fentanyl 150 mcg was administered intravenously. Moderate Sedation Time: 12 minutes. The patient's level of consciousness and vital signs were monitored continuously by radiology nursing throughout the procedure under my direct supervision. COMPLICATIONS: None immediate. PROCEDURE: Informed consent was obtained from the patient following an explanation of the procedure, risks, benefits and alternatives. The patient  understands, agrees and consents for the procedure. All questions were addressed. A time out was performed prior to the initiation of the procedure. The patient was positioned prone and non-contrast localization CT was performed of the pelvis to demonstrate the iliac marrow spaces. The operative site was prepped and draped in the usual sterile fashion. Under sterile conditions and local anesthesia, a 22 gauge spinal needle was utilized for procedural planning. Next, an 11 gauge coaxial bone biopsy needle was advanced into the left iliac marrow space. Needle position was confirmed with CT imaging. Initially, a bone marrow aspiration was performed. Next, a bone marrow biopsy was obtained with the 11 gauge outer bone marrow device. Samples were prepared with the cytotechnologist and deemed adequate. The needle was removed and superficial hemostasis was obtained with manual compression. A dressing was applied. The patient tolerated the procedure well without immediate post procedural complication. IMPRESSION: Successful CT guided left iliac bone marrow aspiration and core biopsy. Electronically Signed   By: Simonne Come M.D.   On: 10/04/2022 11:43    ASSESSMENT AND PLAN: This is a very pleasant 73 years old African-American female with normocytic anemia likely secondary to mixture of iron deficiency as well as vitamin  B12 deficiency.  The patient was treated with iron infusion with Venofer in addition to weekly vitamin B12 injection. The patient had a bone marrow biopsy and aspirate performed recently that showed no concerning findings except for possibility of early MDS.  She was treated with iron infusion in the past with no improvement in her anemia. I recommended for the patient to continue on the over-the-counter oral iron tablet with close monitoring. I will see her back for follow-up visit in 6 months with repeat blood work.  Regarding her knee surgery, the patient may undergo knee surgery but she may require  1-2 units of PRBCs transfusion before or after surgery if she has significant anemia. She was advised to call immediately if she has any other concerning symptoms in the interval. The patient voices understanding of current disease status and treatment options and is in agreement with the current care plan. All questions were answered. The patient knows to call the clinic with any problems, questions or concerns. We can certainly see the patient much sooner if necessary.  The total time spent in the appointment was 30 minutes.  Disclaimer: This note was dictated with voice recognition software. Similar sounding words can inadvertently be transcribed and may not be corrected upon review.

## 2022-10-20 LAB — CERULOPLASMIN: Ceruloplasmin: 36 mg/dL (ref 19.0–39.0)

## 2022-10-20 LAB — COPPER, SERUM: Copper: 135 ug/dL (ref 80–158)

## 2022-10-21 LAB — HEAVY METALS, BLOOD
Arsenic: 1 ug/L (ref 0–9)
Lead: 1.2 ug/dL (ref 0.0–3.4)
Mercury: <1 ug/L (ref 0.0–14.9)

## 2023-02-14 DIAGNOSIS — Z23 Encounter for immunization: Secondary | ICD-10-CM

## 2023-02-14 NOTE — Progress Notes (Signed)
Pt received high dose fluad vaccine

## 2023-04-19 ENCOUNTER — Inpatient Hospital Stay: Payer: PPO | Admitting: Internal Medicine

## 2023-04-19 ENCOUNTER — Inpatient Hospital Stay: Payer: PPO | Attending: Nurse Practitioner

## 2023-04-25 ENCOUNTER — Telehealth: Payer: Self-pay | Admitting: Medical Oncology

## 2023-04-25 NOTE — Telephone Encounter (Signed)
She said she left a message last week to r/s her appt that was on 11/20. Schedule message sent.

## 2023-04-26 ENCOUNTER — Telehealth: Payer: Self-pay | Admitting: Physician Assistant

## 2023-04-26 NOTE — Telephone Encounter (Signed)
Patient is aware of rescheduled appointment times/dates due to missed appointment

## 2023-05-14 NOTE — Progress Notes (Unsigned)
[facility] OFFICE PROGRESS NOTE  [name], NP [address] [place] [address]  DIAGNOSIS: Normocytic anemia with a combination of iron deficiency as well as vitamin B12 deficiency. 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 She also has some CKD.   PRIOR THERAPY:  She is status post treatment vitamin B12 injections.   CURRENT THERAPY:  1) IV iron with Venofer 300 mg as needed most recent dose on 04/19/2022 2) ferrous sulfate 325 mg p.o. daily. 3) She takes B12 p.o. daily 4) Starting Arenesp injections Q3 weeks in January 2025.   INTERVAL HISTORY: Meredith Cline 72 y.o. female returns to the clinic today for a 19-month follow-up visit.  The patient was last seen by Dr. Arbutus Ped on 10/18/22. 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 Dr. Arbutus Ped recommended she continue her iron supplement and observation.   She is here for 6 month follow up today. Her last iron infusion was in  November 2023, the most recent being on 04/19/2022.    She is compliant with her oral iron supplements.  She was previously found to have B12 deficiency for which she underwent B12 injections.  She is no longer taking B12 injections and takes oral B12.  Overall her energy is "low" because she had COVID-19 last week.  She has lightheadedness "sometimes".  She has dyspnea on exertion at baseline.  Denies any changes.  Denies any palpitations or chest pain.  Denies any abnormal bleeding or bruising including epistaxis, gingival bleeding, hemoptysis, hematemesis, melena, or hematochezia.  She is reportedly up to date on her colonoscopy. Her stool cards from when initially referred here were negative for blood. She mentions she has rheumatoid arthritis and is on leflunomide. She has some chronic back pain. She is here today for evaluation repeat blood work.  MEDICAL HISTORY: Past Medical History:  Diagnosis Date    Anemia    Arthritis    Diabetes mellitus without complication (HCC)    GERD (gastroesophageal reflux disease)    Hypercholesterolemia    Hypertension    Sleep apnea     ALLERGIES:  is allergic to gabapentin, clindamycin, and penicillins.  MEDICATIONS:  Current Outpatient Medications  Medication Sig Dispense Refill   amLODipine-valsartan (EXFORGE) 10-320 MG tablet Take 1 tablet by mouth daily.     carvedilol (COREG) 25 MG tablet Take 25 mg by mouth 2 (two) times daily.     cholecalciferol (VITAMIN D3) 25 MCG (1000 UNIT) tablet Take 1,000 Units by mouth daily.     ferrous sulfate 325 (65 FE) MG tablet Take 325 mg by mouth daily with breakfast.     fluticasone (FLONASE) 50 MCG/ACT nasal spray Place 2 sprays into both nostrils daily as needed for allergies or rhinitis.     leflunomide (ARAVA) 20 MG tablet Take 20 mg by mouth daily.     metFORMIN (GLUCOPHAGE) 500 MG tablet Take 500 mg by mouth 2 (two) times daily with a meal.     Multiple Vitamin (MULTI-VITAMIN) tablet Take 1 tablet by mouth in the morning and at bedtime.     Omega-3 Fatty Acids (FISH OIL) 1000 MG CAPS Take 1,000 mg by mouth in the morning and at bedtime.     omeprazole (PRILOSEC) 20 MG capsule Take 20 mg by mouth daily.     pregabalin (LYRICA) 75 MG capsule Take 75 mg by mouth 2 (two) times daily.  simvastatin (ZOCOR) 40 MG tablet Take 40 mg by mouth daily.     torsemide (DEMADEX) 10 MG tablet Take 10 mg by mouth daily.     vitamin B-12 (CYANOCOBALAMIN) 100 MCG tablet Take 100 mcg by mouth daily.     No current facility-administered medications for this visit.    SURGICAL HISTORY:  Past Surgical History:  Procedure Laterality Date   ABDOMINAL HYSTERECTOMY     BREAST REDUCTION SURGERY     CARPAL TUNNEL RELEASE     HERNIA REPAIR     NASAL SEPTUM SURGERY     REDUCTION MAMMAPLASTY     app. 1980    REVIEW OF SYSTEMS:   Review of Systems  Constitutional: Positive for fatigue. Negative for appetite change,  chills, fever and unexpected weight change.  HENT: Negative for mouth sores, nosebleeds, sore throat and trouble swallowing.   Eyes: Negative for eye problems and icterus.  Respiratory: Positive for stable dyspnea on exertion. Negative for cough, hemoptysis, and wheezing.   Cardiovascular: Negative for chest pain and leg swelling.  Gastrointestinal: Negative for abdominal pain, constipation, diarrhea, nausea and vomiting.  Genitourinary: Negative for bladder incontinence, difficulty urinating, dysuria, frequency and hematuria.   Musculoskeletal: Positive for back pain. Negative for gait problem, neck pain and neck stiffness.  Skin: Negative for itching and rash.  Neurological: Negative for dizziness, extremity weakness, gait problem, headaches, light-headedness and seizures.  Hematological: Negative for adenopathy. Does not bruise/bleed easily.  Psychiatric/Behavioral: Negative for confusion, depression and sleep disturbance. The patient is not nervous/anxious.     PHYSICAL EXAMINATION:  There were no vitals taken for this visit.  ECOG PERFORMANCE STATUS: 1  Physical Exam  Constitutional: Oriented to person, place, and time and well-developed, well-nourished, and in no distress.  HENT:  Head: Normocephalic and atraumatic.  Mouth/Throat: Oropharynx is clear and moist. No oropharyngeal exudate.  Eyes: Conjunctivae are normal. Right eye exhibits no discharge. Left eye exhibits no discharge. No scleral icterus.  Neck: Normal range of motion. Neck supple.  Cardiovascular: Normal rate, regular rhythm, normal heart sounds and intact distal pulses.   Pulmonary/Chest: Effort normal and breath sounds normal. No respiratory distress. No wheezes. No rales.  Abdominal: Soft. Bowel sounds are normal. Exhibits no distension and no mass. There is no tenderness.  Musculoskeletal: Normal range of motion. Exhibits no edema.  Lymphadenopathy:    No cervical adenopathy.  Neurological: Alert and oriented  to person, place, and time. Exhibits normal muscle tone. Gait normal. Coordination normal.  Skin: Skin is warm and dry. No rash noted. Not diaphoretic. No erythema. No pallor.  Psychiatric: Mood, memory and judgment normal.  Vitals reviewed.  LABORATORY DATA: Lab Results  Component Value Date   WBC 5.2 10/18/2022   HGB 10.2 (L) 10/18/2022   HCT 33.2 (L) 10/18/2022   MCV 85.3 10/18/2022   PLT 262 10/18/2022      Chemistry      Component Value Date/Time   NA 142 03/28/2022 0956   K 4.0 03/28/2022 0956   CL 107 03/28/2022 0956   CO2 28 03/28/2022 0956   BUN 25 (H) 03/28/2022 0956   CREATININE 1.40 (H) 03/28/2022 0956      Component Value Date/Time   CALCIUM 9.5 03/28/2022 0956   ALKPHOS 25 (L) 03/28/2022 0956   AST 16 03/28/2022 0956   ALT 10 03/28/2022 0956   BILITOT 0.4 03/28/2022 0956       RADIOGRAPHIC STUDIES:  No results found.   ASSESSMENT/PLAN:  This is a very pleasant  73 year old African-American female with normocytic anemia likely secondary to iron deficiency anemia and B12 deficiency. She also previously had a bone marrow biopsy in spring 2024 which showed possibele early MDS. She also has CKD   The patient receives as needed IV iron infusions with Venofer, the most recent being on 04/19/2022.   She also takes iron supplements p.o. daily.   The patient also was previously on B12 injections.  She is taking over-the-counter B12 supplements 1000.    The patient had repeat labs today with a CBC, CMP, iron studies, or ferritin. I reviewed her labs with Dr. Arbutus Ped. Her anemia is worsening with Hbg of 8.8. Her iron studies look stable over the last year. Her CMP showed CKD. She has prior extensive workup.   Dr. Arbutus Ped recommended continued observation vs considering starting aranesp injections. The patient is interested in this and we will plan to start in January. Will administer every 3 weeks to keep Hbg >10. Hold Aranesp if Hbg >10.    We will see her back  for follow-up visit and repeat lab work in 3 months.   She will continue taking her oral ferrous sulfate as well as her B12 supplement.   I will call her if she needs iron infusion based on her pending ferritin.   The patient was advised to call immediately if she has any concerning symptoms in the interval. The patient voices understanding of current disease status and treatment options and is in agreement with the current care plan. All questions were answered. The patient knows to call the clinic with any problems, questions or concerns. We can certainly see the patient much sooner if necessary       No orders of the defined types were placed in this encounter.   The total time spent in the appointment was 20-29 minutes  Georga Stys L Keren Alverio, PA-C 05/14/23

## 2023-05-18 ENCOUNTER — Other Ambulatory Visit: Payer: Self-pay | Admitting: Internal Medicine

## 2023-05-18 ENCOUNTER — Inpatient Hospital Stay: Payer: PPO | Admitting: Physician Assistant

## 2023-05-18 ENCOUNTER — Inpatient Hospital Stay: Payer: PPO | Attending: Nurse Practitioner

## 2023-05-18 VITALS — BP 137/57 | HR 78 | Temp 97.5°F | Resp 19 | Wt 168.9 lb

## 2023-05-18 DIAGNOSIS — D631 Anemia in chronic kidney disease: Secondary | ICD-10-CM

## 2023-05-18 DIAGNOSIS — N189 Chronic kidney disease, unspecified: Secondary | ICD-10-CM | POA: Insufficient documentation

## 2023-05-18 DIAGNOSIS — E538 Deficiency of other specified B group vitamins: Secondary | ICD-10-CM | POA: Insufficient documentation

## 2023-05-18 DIAGNOSIS — D539 Nutritional anemia, unspecified: Secondary | ICD-10-CM

## 2023-05-18 DIAGNOSIS — D649 Anemia, unspecified: Secondary | ICD-10-CM | POA: Insufficient documentation

## 2023-05-18 LAB — CBC WITH DIFFERENTIAL (CANCER CENTER ONLY)
Abs Immature Granulocytes: 0.01 10*3/uL (ref 0.00–0.07)
Basophils Absolute: 0 10*3/uL (ref 0.0–0.1)
Basophils Relative: 1 %
Eosinophils Absolute: 0.2 10*3/uL (ref 0.0–0.5)
Eosinophils Relative: 4 %
HCT: 28.5 % — ABNORMAL LOW (ref 36.0–46.0)
Hemoglobin: 8.8 g/dL — ABNORMAL LOW (ref 12.0–15.0)
Immature Granulocytes: 0 %
Lymphocytes Relative: 31 %
Lymphs Abs: 1.7 10*3/uL (ref 0.7–4.0)
MCH: 26.7 pg (ref 26.0–34.0)
MCHC: 30.9 g/dL (ref 30.0–36.0)
MCV: 86.4 fL (ref 80.0–100.0)
Monocytes Absolute: 0.6 10*3/uL (ref 0.1–1.0)
Monocytes Relative: 12 %
Neutro Abs: 2.9 10*3/uL (ref 1.7–7.7)
Neutrophils Relative %: 52 %
Platelet Count: 261 10*3/uL (ref 150–400)
RBC: 3.3 MIL/uL — ABNORMAL LOW (ref 3.87–5.11)
RDW: 17.4 % — ABNORMAL HIGH (ref 11.5–15.5)
WBC Count: 5.6 10*3/uL (ref 4.0–10.5)
nRBC: 0 % (ref 0.0–0.2)

## 2023-05-18 LAB — CMP (CANCER CENTER ONLY)
ALT: 11 U/L (ref 0–44)
AST: 21 U/L (ref 15–41)
Albumin: 3.8 g/dL (ref 3.5–5.0)
Alkaline Phosphatase: 25 U/L — ABNORMAL LOW (ref 38–126)
Anion gap: 10 (ref 5–15)
BUN: 30 mg/dL — ABNORMAL HIGH (ref 8–23)
CO2: 26 mmol/L (ref 22–32)
Calcium: 9.3 mg/dL (ref 8.9–10.3)
Chloride: 107 mmol/L (ref 98–111)
Creatinine: 1.78 mg/dL — ABNORMAL HIGH (ref 0.44–1.00)
GFR, Estimated: 30 mL/min — ABNORMAL LOW (ref >60)
Glucose, Bld: 109 mg/dL — ABNORMAL HIGH (ref 70–99)
Potassium: 4.4 mmol/L (ref 3.5–5.1)
Sodium: 143 mmol/L (ref 135–145)
Total Bilirubin: 0.4 mg/dL (ref <1.2)
Total Protein: 6.7 g/dL (ref 6.5–8.1)

## 2023-05-18 LAB — IRON AND IRON BINDING CAPACITY (CC-WL,HP ONLY)
Iron: 44 ug/dL (ref 28–170)
Saturation Ratios: 22 % (ref 10.4–31.8)
TIBC: 200 ug/dL — ABNORMAL LOW (ref 250–450)
UIBC: 156 ug/dL (ref 148–442)

## 2023-05-19 LAB — FERRITIN: Ferritin: 802 ng/mL — ABNORMAL HIGH (ref 11–307)

## 2023-05-23 ENCOUNTER — Telehealth: Payer: Self-pay

## 2023-05-23 NOTE — Telephone Encounter (Signed)
Spoke with patient in regards to iron results.  Per Cassie, PA-  Patient does not need IV iron at this time. Patient should be receiving a call from scheduling to schedule upcoming appts. Pt verbalized understanding.

## 2023-06-05 ENCOUNTER — Inpatient Hospital Stay: Payer: PPO

## 2023-06-05 ENCOUNTER — Inpatient Hospital Stay: Payer: PPO | Attending: Nurse Practitioner

## 2023-06-05 VITALS — BP 148/53 | HR 72 | Temp 98.9°F

## 2023-06-05 DIAGNOSIS — D539 Nutritional anemia, unspecified: Secondary | ICD-10-CM

## 2023-06-05 DIAGNOSIS — D649 Anemia, unspecified: Secondary | ICD-10-CM | POA: Insufficient documentation

## 2023-06-05 DIAGNOSIS — E538 Deficiency of other specified B group vitamins: Secondary | ICD-10-CM | POA: Diagnosis not present

## 2023-06-05 DIAGNOSIS — N1832 Chronic kidney disease, stage 3b: Secondary | ICD-10-CM | POA: Insufficient documentation

## 2023-06-05 DIAGNOSIS — D509 Iron deficiency anemia, unspecified: Secondary | ICD-10-CM

## 2023-06-05 DIAGNOSIS — D631 Anemia in chronic kidney disease: Secondary | ICD-10-CM | POA: Insufficient documentation

## 2023-06-05 DIAGNOSIS — N189 Chronic kidney disease, unspecified: Secondary | ICD-10-CM | POA: Insufficient documentation

## 2023-06-05 LAB — CBC WITH DIFFERENTIAL (CANCER CENTER ONLY)
Abs Immature Granulocytes: 0.01 10*3/uL (ref 0.00–0.07)
Basophils Absolute: 0.1 10*3/uL (ref 0.0–0.1)
Basophils Relative: 1 %
Eosinophils Absolute: 0.4 10*3/uL (ref 0.0–0.5)
Eosinophils Relative: 5 %
HCT: 30.8 % — ABNORMAL LOW (ref 36.0–46.0)
Hemoglobin: 9.5 g/dL — ABNORMAL LOW (ref 12.0–15.0)
Immature Granulocytes: 0 %
Lymphocytes Relative: 27 %
Lymphs Abs: 1.7 10*3/uL (ref 0.7–4.0)
MCH: 26.7 pg (ref 26.0–34.0)
MCHC: 30.8 g/dL (ref 30.0–36.0)
MCV: 86.5 fL (ref 80.0–100.0)
Monocytes Absolute: 0.8 10*3/uL (ref 0.1–1.0)
Monocytes Relative: 12 %
Neutro Abs: 3.6 10*3/uL (ref 1.7–7.7)
Neutrophils Relative %: 55 %
Platelet Count: 269 10*3/uL (ref 150–400)
RBC: 3.56 MIL/uL — ABNORMAL LOW (ref 3.87–5.11)
RDW: 17.7 % — ABNORMAL HIGH (ref 11.5–15.5)
WBC Count: 6.5 10*3/uL (ref 4.0–10.5)
nRBC: 0 % (ref 0.0–0.2)

## 2023-06-05 MED ORDER — DARBEPOETIN ALFA 300 MCG/0.6ML IJ SOSY
300.0000 ug | PREFILLED_SYRINGE | Freq: Once | INTRAMUSCULAR | Status: AC
  Administered 2023-06-05: 300 ug via SUBCUTANEOUS
  Filled 2023-06-05: qty 0.6

## 2023-06-05 MED ORDER — CYANOCOBALAMIN 1000 MCG/ML IJ SOLN
1000.0000 ug | Freq: Once | INTRAMUSCULAR | Status: AC
  Administered 2023-06-05: 1000 ug via INTRAMUSCULAR
  Filled 2023-06-05: qty 1

## 2023-07-03 ENCOUNTER — Inpatient Hospital Stay: Payer: PPO | Attending: Nurse Practitioner

## 2023-07-03 ENCOUNTER — Inpatient Hospital Stay: Payer: PPO

## 2023-07-03 VITALS — BP 157/82 | HR 67 | Temp 98.2°F | Resp 19

## 2023-07-03 DIAGNOSIS — D509 Iron deficiency anemia, unspecified: Secondary | ICD-10-CM

## 2023-07-03 DIAGNOSIS — N1832 Chronic kidney disease, stage 3b: Secondary | ICD-10-CM | POA: Insufficient documentation

## 2023-07-03 DIAGNOSIS — D539 Nutritional anemia, unspecified: Secondary | ICD-10-CM

## 2023-07-03 DIAGNOSIS — E538 Deficiency of other specified B group vitamins: Secondary | ICD-10-CM | POA: Diagnosis not present

## 2023-07-03 DIAGNOSIS — D649 Anemia, unspecified: Secondary | ICD-10-CM | POA: Diagnosis not present

## 2023-07-03 DIAGNOSIS — D631 Anemia in chronic kidney disease: Secondary | ICD-10-CM | POA: Insufficient documentation

## 2023-07-03 LAB — CBC WITH DIFFERENTIAL (CANCER CENTER ONLY)
Abs Immature Granulocytes: 0.01 10*3/uL (ref 0.00–0.07)
Basophils Absolute: 0.1 10*3/uL (ref 0.0–0.1)
Basophils Relative: 2 %
Eosinophils Absolute: 0.3 10*3/uL (ref 0.0–0.5)
Eosinophils Relative: 7 %
HCT: 33.8 % — ABNORMAL LOW (ref 36.0–46.0)
Hemoglobin: 10.3 g/dL — ABNORMAL LOW (ref 12.0–15.0)
Immature Granulocytes: 0 %
Lymphocytes Relative: 39 %
Lymphs Abs: 1.8 10*3/uL (ref 0.7–4.0)
MCH: 26.5 pg (ref 26.0–34.0)
MCHC: 30.5 g/dL (ref 30.0–36.0)
MCV: 87.1 fL (ref 80.0–100.0)
Monocytes Absolute: 0.6 10*3/uL (ref 0.1–1.0)
Monocytes Relative: 13 %
Neutro Abs: 1.7 10*3/uL (ref 1.7–7.7)
Neutrophils Relative %: 39 %
Platelet Count: 269 10*3/uL (ref 150–400)
RBC: 3.88 MIL/uL (ref 3.87–5.11)
RDW: 17.6 % — ABNORMAL HIGH (ref 11.5–15.5)
WBC Count: 4.4 10*3/uL (ref 4.0–10.5)
nRBC: 0 % (ref 0.0–0.2)

## 2023-07-03 MED ORDER — CYANOCOBALAMIN 1000 MCG/ML IJ SOLN
1000.0000 ug | Freq: Once | INTRAMUSCULAR | Status: AC
  Administered 2023-07-03: 1000 ug via INTRAMUSCULAR
  Filled 2023-07-03: qty 1

## 2023-07-03 MED ORDER — DARBEPOETIN ALFA 300 MCG/0.6ML IJ SOSY
300.0000 ug | PREFILLED_SYRINGE | INTRAMUSCULAR | Status: DC
  Administered 2023-07-03: 300 ug via SUBCUTANEOUS
  Filled 2023-07-03: qty 0.6

## 2023-07-24 ENCOUNTER — Inpatient Hospital Stay: Payer: PPO

## 2023-07-24 DIAGNOSIS — D539 Nutritional anemia, unspecified: Secondary | ICD-10-CM

## 2023-07-24 DIAGNOSIS — N1832 Chronic kidney disease, stage 3b: Secondary | ICD-10-CM | POA: Diagnosis not present

## 2023-07-24 LAB — CBC WITH DIFFERENTIAL (CANCER CENTER ONLY)
Abs Immature Granulocytes: 0.01 10*3/uL (ref 0.00–0.07)
Basophils Absolute: 0.1 10*3/uL (ref 0.0–0.1)
Basophils Relative: 1 %
Eosinophils Absolute: 0.2 10*3/uL (ref 0.0–0.5)
Eosinophils Relative: 6 %
HCT: 35.4 % — ABNORMAL LOW (ref 36.0–46.0)
Hemoglobin: 10.8 g/dL — ABNORMAL LOW (ref 12.0–15.0)
Immature Granulocytes: 0 %
Lymphocytes Relative: 38 %
Lymphs Abs: 1.4 10*3/uL (ref 0.7–4.0)
MCH: 26.4 pg (ref 26.0–34.0)
MCHC: 30.5 g/dL (ref 30.0–36.0)
MCV: 86.6 fL (ref 80.0–100.0)
Monocytes Absolute: 0.5 10*3/uL (ref 0.1–1.0)
Monocytes Relative: 13 %
Neutro Abs: 1.5 10*3/uL — ABNORMAL LOW (ref 1.7–7.7)
Neutrophils Relative %: 42 %
Platelet Count: 249 10*3/uL (ref 150–400)
RBC: 4.09 MIL/uL (ref 3.87–5.11)
RDW: 18.4 % — ABNORMAL HIGH (ref 11.5–15.5)
WBC Count: 3.6 10*3/uL — ABNORMAL LOW (ref 4.0–10.5)
nRBC: 0 % (ref 0.0–0.2)

## 2023-07-24 NOTE — Progress Notes (Signed)
 Pt hgb 10.8. Aranesp held per MD order. Labs printed and given to pt. Pt verbalized understanding.

## 2023-08-08 ENCOUNTER — Other Ambulatory Visit: Payer: Self-pay | Admitting: Nurse Practitioner

## 2023-08-08 DIAGNOSIS — Z1231 Encounter for screening mammogram for malignant neoplasm of breast: Secondary | ICD-10-CM

## 2023-08-14 ENCOUNTER — Inpatient Hospital Stay: Payer: PPO | Attending: Nurse Practitioner

## 2023-08-14 ENCOUNTER — Inpatient Hospital Stay: Payer: PPO

## 2023-08-14 VITALS — BP 158/72 | HR 70 | Temp 98.4°F | Resp 18

## 2023-08-14 DIAGNOSIS — D631 Anemia in chronic kidney disease: Secondary | ICD-10-CM | POA: Insufficient documentation

## 2023-08-14 DIAGNOSIS — E538 Deficiency of other specified B group vitamins: Secondary | ICD-10-CM | POA: Diagnosis not present

## 2023-08-14 DIAGNOSIS — N1832 Chronic kidney disease, stage 3b: Secondary | ICD-10-CM | POA: Insufficient documentation

## 2023-08-14 DIAGNOSIS — D649 Anemia, unspecified: Secondary | ICD-10-CM | POA: Diagnosis not present

## 2023-08-14 DIAGNOSIS — D539 Nutritional anemia, unspecified: Secondary | ICD-10-CM

## 2023-08-14 DIAGNOSIS — D509 Iron deficiency anemia, unspecified: Secondary | ICD-10-CM

## 2023-08-14 LAB — CBC WITH DIFFERENTIAL (CANCER CENTER ONLY)
Abs Immature Granulocytes: 0.02 10*3/uL (ref 0.00–0.07)
Basophils Absolute: 0.1 10*3/uL (ref 0.0–0.1)
Basophils Relative: 1 %
Eosinophils Absolute: 0.3 10*3/uL (ref 0.0–0.5)
Eosinophils Relative: 5 %
HCT: 33.5 % — ABNORMAL LOW (ref 36.0–46.0)
Hemoglobin: 10.2 g/dL — ABNORMAL LOW (ref 12.0–15.0)
Immature Granulocytes: 0 %
Lymphocytes Relative: 28 %
Lymphs Abs: 1.6 10*3/uL (ref 0.7–4.0)
MCH: 25.8 pg — ABNORMAL LOW (ref 26.0–34.0)
MCHC: 30.4 g/dL (ref 30.0–36.0)
MCV: 84.6 fL (ref 80.0–100.0)
Monocytes Absolute: 0.6 10*3/uL (ref 0.1–1.0)
Monocytes Relative: 10 %
Neutro Abs: 3.2 10*3/uL (ref 1.7–7.7)
Neutrophils Relative %: 56 %
Platelet Count: 216 10*3/uL (ref 150–400)
RBC: 3.96 MIL/uL (ref 3.87–5.11)
RDW: 18.4 % — ABNORMAL HIGH (ref 11.5–15.5)
WBC Count: 5.7 10*3/uL (ref 4.0–10.5)
nRBC: 0 % (ref 0.0–0.2)

## 2023-08-14 LAB — IRON AND IRON BINDING CAPACITY (CC-WL,HP ONLY)
Iron: 38 ug/dL (ref 28–170)
Saturation Ratios: 17 % (ref 10.4–31.8)
TIBC: 227 ug/dL — ABNORMAL LOW (ref 250–450)
UIBC: 189 ug/dL (ref 148–442)

## 2023-08-14 LAB — FERRITIN: Ferritin: 525 ng/mL — ABNORMAL HIGH (ref 11–307)

## 2023-08-14 MED ORDER — CYANOCOBALAMIN 1000 MCG/ML IJ SOLN
1000.0000 ug | Freq: Once | INTRAMUSCULAR | Status: DC
  Filled 2023-08-14: qty 1

## 2023-08-14 MED ORDER — DARBEPOETIN ALFA 300 MCG/0.6ML IJ SOSY
300.0000 ug | PREFILLED_SYRINGE | INTRAMUSCULAR | Status: DC
  Filled 2023-08-14: qty 0.6

## 2023-08-14 NOTE — Progress Notes (Signed)
 Aranesp held today for Hgb >10 per Cassie Heilingoetter, PA.  Drusilla Kanner, PharmD, MBA

## 2023-09-04 ENCOUNTER — Ambulatory Visit: Payer: PPO

## 2023-09-04 ENCOUNTER — Inpatient Hospital Stay: Payer: PPO | Attending: Nurse Practitioner

## 2023-09-04 ENCOUNTER — Inpatient Hospital Stay: Payer: PPO | Admitting: Internal Medicine

## 2023-09-04 VITALS — BP 160/65 | HR 68 | Temp 97.3°F | Resp 17 | Ht 63.0 in | Wt 212.4 lb

## 2023-09-04 DIAGNOSIS — D539 Nutritional anemia, unspecified: Secondary | ICD-10-CM

## 2023-09-04 DIAGNOSIS — E538 Deficiency of other specified B group vitamins: Secondary | ICD-10-CM | POA: Insufficient documentation

## 2023-09-04 DIAGNOSIS — D509 Iron deficiency anemia, unspecified: Secondary | ICD-10-CM | POA: Insufficient documentation

## 2023-09-04 DIAGNOSIS — N1832 Chronic kidney disease, stage 3b: Secondary | ICD-10-CM | POA: Diagnosis present

## 2023-09-04 DIAGNOSIS — D631 Anemia in chronic kidney disease: Secondary | ICD-10-CM | POA: Insufficient documentation

## 2023-09-04 LAB — CBC WITH DIFFERENTIAL (CANCER CENTER ONLY)
Abs Immature Granulocytes: 0.01 10*3/uL (ref 0.00–0.07)
Basophils Absolute: 0.1 10*3/uL (ref 0.0–0.1)
Basophils Relative: 2 %
Eosinophils Absolute: 0.3 10*3/uL (ref 0.0–0.5)
Eosinophils Relative: 6 %
HCT: 32.7 % — ABNORMAL LOW (ref 36.0–46.0)
Hemoglobin: 10 g/dL — ABNORMAL LOW (ref 12.0–15.0)
Immature Granulocytes: 0 %
Lymphocytes Relative: 35 %
Lymphs Abs: 1.6 10*3/uL (ref 0.7–4.0)
MCH: 25.8 pg — ABNORMAL LOW (ref 26.0–34.0)
MCHC: 30.6 g/dL (ref 30.0–36.0)
MCV: 84.5 fL (ref 80.0–100.0)
Monocytes Absolute: 0.6 10*3/uL (ref 0.1–1.0)
Monocytes Relative: 12 %
Neutro Abs: 2 10*3/uL (ref 1.7–7.7)
Neutrophils Relative %: 45 %
Platelet Count: 229 10*3/uL (ref 150–400)
RBC: 3.87 MIL/uL (ref 3.87–5.11)
RDW: 18.4 % — ABNORMAL HIGH (ref 11.5–15.5)
WBC Count: 4.5 10*3/uL (ref 4.0–10.5)
nRBC: 0 % (ref 0.0–0.2)

## 2023-09-04 NOTE — Progress Notes (Signed)
 [facility] [phone]   [phone]  OFFICE PROGRESS NOTE  [name], NP [address] [place] [address]  DIAGNOSIS: Normocytic anemia with a combination of iron deficiency as well as vitamin B12 deficiency.  PRIOR THERAPY: She is status post treatment with Venofer 200 Mg IV weekly for 4 doses in addition to vitamin B12 injections.  CURRENT THERAPY: Ferrous sulfate 325 mg p.o. daily.  INTERVAL HISTORY: Meredith Cline 73 y.o. female returns to the clinic today for 91-month follow-up visit.Discussed the use of AI scribe software for clinical note transcription with the patient, who gave verbal consent to proceed.  History of Present Illness   Meredith Cline is a 73 year old female with normocytic anemia due to iron and vitamin B12 deficiency who presents for evaluation and repeat blood work.  She has normocytic anemia due to a combination of iron deficiency and vitamin B12 deficiency. She is currently taking over-the-counter ferrous sulfate 325 mg orally once daily, often with vitamin C to enhance absorption. She has previously been treated with iron infusions using Venofer. Her recent lab work shows a hemoglobin level of 10.0 g/dL, consistent with her previous result of 10.2 g/dL from March. Iron studies, including ferritin levels, were checked on March 17 and were reported to be within normal limits. She continues to take vitamin B12 supplements. No significant fatigue, dizziness, or shortness of breath, but she mentions feeling 'off balance' at times, which she attributes to mild dizziness.  Over the past six months, she has been unable to walk long distances due to severe lower back pain. She has discussed this issue with her primary care doctor but has not found relief.       MEDICAL HISTORY: Past Medical History:  Diagnosis Date   Anemia    Arthritis    Diabetes mellitus without complication (HCC)    GERD (gastroesophageal  reflux disease)    Hypercholesterolemia    Hypertension    Sleep apnea     ALLERGIES:  is allergic to gabapentin, clindamycin, and penicillins.  MEDICATIONS:  Current Outpatient Medications  Medication Sig Dispense Refill   amLODipine-valsartan (EXFORGE) 10-320 MG tablet Take 1 tablet by mouth daily.     carvedilol (COREG) 25 MG tablet Take 25 mg by mouth 2 (two) times daily.     cholecalciferol (VITAMIN D3) 25 MCG (1000 UNIT) tablet Take 1,000 Units by mouth daily.     ferrous sulfate 325 (65 FE) MG tablet Take 325 mg by mouth daily with breakfast.     fluticasone (FLONASE) 50 MCG/ACT nasal spray Place 2 sprays into both nostrils daily as needed for allergies or rhinitis.     leflunomide (ARAVA) 20 MG tablet Take 20 mg by mouth daily.     metFORMIN (GLUCOPHAGE) 500 MG tablet Take 500 mg by mouth 2 (two) times daily with a meal.     Multiple Vitamin (MULTI-VITAMIN) tablet Take 1 tablet by mouth in the morning and at bedtime.     Omega-3 Fatty Acids (FISH OIL) 1000 MG CAPS Take 1,000 mg by mouth in the morning and at bedtime.     omeprazole (PRILOSEC) 20 MG capsule Take 20 mg by mouth daily.     pregabalin (LYRICA) 75 MG capsule Take 75 mg by mouth 2 (two) times daily.     simvastatin (ZOCOR) 40 MG tablet Take 40 mg by mouth daily.     torsemide (DEMADEX) 10 MG tablet Take 10 mg by mouth  daily.     vitamin B-12 (CYANOCOBALAMIN) 100 MCG tablet Take 100 mcg by mouth daily.     No current facility-administered medications for this visit.    SURGICAL HISTORY:  Past Surgical History:  Procedure Laterality Date   ABDOMINAL HYSTERECTOMY     BREAST REDUCTION SURGERY     CARPAL TUNNEL RELEASE     HERNIA REPAIR     NASAL SEPTUM SURGERY     REDUCTION MAMMAPLASTY     app. 1980    REVIEW OF SYSTEMS:  A comprehensive review of systems was negative except for: Constitutional: positive for fatigue   PHYSICAL EXAMINATION: General appearance: alert, cooperative, fatigued, and no  distress Head: Normocephalic, without obvious abnormality, atraumatic Neck: no adenopathy, no JVD, supple, symmetrical, trachea midline, and thyroid not enlarged, symmetric, no tenderness/mass/nodules Lymph nodes: Cervical, supraclavicular, and axillary nodes normal. Resp: clear to auscultation bilaterally Back: symmetric, no curvature. ROM normal. No CVA tenderness. Cardio: regular rate and rhythm, S1, S2 normal, no murmur, click, rub or gallop GI: soft, non-tender; bowel sounds normal; no masses,  no organomegaly Extremities: extremities normal, atraumatic, no cyanosis or edema  ECOG PERFORMANCE STATUS: 1 - Symptomatic but completely ambulatory  Blood pressure (!) 160/65, pulse 68, temperature (!) 97.3 F (36.3 C), temperature source Temporal, resp. rate 17, height 5\' 3"  (1.6 m), weight 212 lb 6.4 oz (96.3 kg), SpO2 91%.  LABORATORY DATA: Lab Results  Component Value Date   WBC 4.5 09/04/2023   HGB 10.0 (L) 09/04/2023   HCT 32.7 (L) 09/04/2023   MCV 84.5 09/04/2023   PLT 229 09/04/2023      Chemistry      Component Value Date/Time   NA 143 05/18/2023 1501   K 4.4 05/18/2023 1501   CL 107 05/18/2023 1501   CO2 26 05/18/2023 1501   BUN 30 (H) 05/18/2023 1501   CREATININE 1.78 (H) 05/18/2023 1501      Component Value Date/Time   CALCIUM 9.3 05/18/2023 1501   ALKPHOS 25 (L) 05/18/2023 1501   AST 21 05/18/2023 1501   ALT 11 05/18/2023 1501   BILITOT 0.4 05/18/2023 1501       RADIOGRAPHIC STUDIES: No results found.  ASSESSMENT AND PLAN: This is a very pleasant 73 years old African-American female with normocytic anemia likely secondary to mixture of iron deficiency as well as vitamin B12 deficiency.  The patient was treated with iron infusion with Venofer in addition to weekly vitamin B12 injection. The patient had a bone marrow biopsy and aspirate performed recently that showed no concerning findings except for possibility of early MDS.  She was treated with iron  infusion in the past with no improvement in her anemia.    Normocytic anemia with iron and vitamin B12 deficiency Meredith Cline, a 73 year old female, presents with normocytic anemia due to iron and vitamin B12 deficiency. Her hemoglobin level is 10.0 g/dL, consistent with the previous measurement of 10.2 g/dL in March. Iron studies, including iron and ferritin levels, are within normal limits, indicating no current need for iron infusion. She is taking over-the-counter ferrous sulfate 325 mg daily and vitamin B12 supplements. She reports fatigue and occasional dizziness but has adapted to her current hemoglobin level. - Continue over-the-counter ferrous sulfate 325 mg daily. - Continue vitamin B12 supplementation. - Repeat blood work in six months, including CBC, iron study, and ferritin levels.  Chronic lower back pain Meredith Cline reports chronic lower back pain that worsens with prolonged walking. She has discussed this issue with her primary  care physician, but no specific treatment has been offered. The pain is likely related to age-related changes such as arthritis.   The patient was advised to call immediately if she has any concerning symptoms in the interval. The patient voices understanding of current disease status and treatment options and is in agreement with the current care plan. All questions were answered. The patient knows to call the clinic with any problems, questions or concerns. We can certainly see the patient much sooner if necessary.  The total time spent in the appointment was 20 minutes.  Disclaimer: This note was dictated with voice recognition software. Similar sounding words can inadvertently be transcribed and may not be corrected upon review.

## 2023-09-07 ENCOUNTER — Ambulatory Visit
Admission: RE | Admit: 2023-09-07 | Discharge: 2023-09-07 | Disposition: A | Discharge status: Home / Self Care | Source: Ambulatory Visit | Attending: Nurse Practitioner | Admitting: Nurse Practitioner

## 2023-09-07 DIAGNOSIS — Z1231 Encounter for screening mammogram for malignant neoplasm of breast: Secondary | ICD-10-CM

## 2023-09-22 ENCOUNTER — Other Ambulatory Visit: Payer: Self-pay | Admitting: Medical Oncology

## 2023-09-22 DIAGNOSIS — D539 Nutritional anemia, unspecified: Secondary | ICD-10-CM

## 2023-09-25 ENCOUNTER — Inpatient Hospital Stay: Payer: PPO

## 2023-09-25 VITALS — BP 139/55 | HR 73 | Temp 98.6°F | Resp 16

## 2023-09-25 DIAGNOSIS — D539 Nutritional anemia, unspecified: Secondary | ICD-10-CM

## 2023-09-25 DIAGNOSIS — D509 Iron deficiency anemia, unspecified: Secondary | ICD-10-CM

## 2023-09-25 DIAGNOSIS — E538 Deficiency of other specified B group vitamins: Secondary | ICD-10-CM

## 2023-09-25 DIAGNOSIS — N1832 Chronic kidney disease, stage 3b: Secondary | ICD-10-CM | POA: Diagnosis not present

## 2023-09-25 LAB — CBC WITH DIFFERENTIAL (CANCER CENTER ONLY)
Abs Immature Granulocytes: 0.02 10*3/uL (ref 0.00–0.07)
Basophils Absolute: 0.1 10*3/uL (ref 0.0–0.1)
Basophils Relative: 1 %
Eosinophils Absolute: 0.3 10*3/uL (ref 0.0–0.5)
Eosinophils Relative: 5 %
HCT: 33 % — ABNORMAL LOW (ref 36.0–46.0)
Hemoglobin: 10.2 g/dL — ABNORMAL LOW (ref 12.0–15.0)
Immature Granulocytes: 0 %
Lymphocytes Relative: 32 %
Lymphs Abs: 1.7 10*3/uL (ref 0.7–4.0)
MCH: 26 pg (ref 26.0–34.0)
MCHC: 30.9 g/dL (ref 30.0–36.0)
MCV: 84 fL (ref 80.0–100.0)
Monocytes Absolute: 0.7 10*3/uL (ref 0.1–1.0)
Monocytes Relative: 13 %
Neutro Abs: 2.7 10*3/uL (ref 1.7–7.7)
Neutrophils Relative %: 49 %
Platelet Count: 217 10*3/uL (ref 150–400)
RBC: 3.93 MIL/uL (ref 3.87–5.11)
RDW: 18.7 % — ABNORMAL HIGH (ref 11.5–15.5)
WBC Count: 5.4 10*3/uL (ref 4.0–10.5)
nRBC: 0 % (ref 0.0–0.2)

## 2023-09-25 MED ORDER — CYANOCOBALAMIN 1000 MCG/ML IJ SOLN
1000.0000 ug | Freq: Once | INTRAMUSCULAR | Status: AC
  Administered 2023-09-25: 1000 ug via INTRAMUSCULAR
  Filled 2023-09-25: qty 1

## 2023-09-25 MED ORDER — DARBEPOETIN ALFA 300 MCG/0.6ML IJ SOSY
300.0000 ug | PREFILLED_SYRINGE | INTRAMUSCULAR | Status: DC
  Administered 2023-09-25: 300 ug via SUBCUTANEOUS
  Filled 2023-09-25: qty 0.6

## 2023-12-14 ENCOUNTER — Encounter: Payer: Self-pay | Admitting: Podiatry

## 2023-12-14 ENCOUNTER — Ambulatory Visit: Admitting: Podiatry

## 2023-12-14 DIAGNOSIS — E1142 Type 2 diabetes mellitus with diabetic polyneuropathy: Secondary | ICD-10-CM | POA: Diagnosis not present

## 2023-12-14 MED ORDER — PREGABALIN 150 MG PO CAPS: 150.0000 mg | ORAL_CAPSULE | Freq: Two times a day (BID) | ORAL | 3 refills | Status: DC

## 2023-12-17 NOTE — Progress Notes (Signed)
 Subjective:  Patient ID: Meredith Cline, female    DOB: 1951/05/02,  MRN: [id number] HPI Chief Complaint  Patient presents with   Diabetes    Diabetic foot exam - last 5.5, having tingling and numbness mainly around the toes, but the rest of the foot feels like she has something stuck to her feet, swelling in ankles, gait is off, cold sensations, can't wear shoes without socks due to sensitivity, pregablin-taking that for skin sensitivity on legs, but not helping with that   New Patient (Initial Visit)    73 y.o. female presents with the above complaint.   ROS: Denies fever chills nausea mobic muscle aches pains calf pain back pain chest pain shortness of breath  Past Medical History:  Diagnosis Date   Anemia    Arthritis    Diabetes mellitus without complication (HCC)    GERD (gastroesophageal reflux disease)    Hypercholesterolemia    Hypertension    Sleep apnea    Past Surgical History:  Procedure Laterality Date   ABDOMINAL HYSTERECTOMY     BREAST REDUCTION SURGERY     CARPAL TUNNEL RELEASE     HERNIA REPAIR     NASAL SEPTUM SURGERY     REDUCTION MAMMAPLASTY     app. 1980    Current Outpatient Medications:    amLODipine-valsartan (EXFORGE) 10-320 MG tablet, Take 1 tablet by mouth daily., Disp: , Rfl:    baclofen (LIORESAL) 10 MG tablet, Take 10 mg by mouth 2 (two) times daily., Disp: , Rfl:    BIOTIN PO, Take by mouth., Disp: , Rfl:    carvedilol (COREG) 25 MG tablet, Take 25 mg by mouth 2 (two) times daily., Disp: , Rfl:    cholecalciferol (VITAMIN D3) 25 MCG (1000 UNIT) tablet, Take 1,000 Units by mouth daily., Disp: , Rfl:    FARXIGA 10 MG TABS tablet, Take 10 mg by mouth daily., Disp: , Rfl:    ferrous sulfate 325 (65 FE) MG tablet, Take 325 mg by mouth daily with breakfast., Disp: , Rfl:    fluticasone (FLONASE) 50 MCG/ACT nasal spray, Place 2 sprays into both nostrils daily as needed for allergies or rhinitis., Disp: , Rfl:    leflunomide (ARAVA) 20 MG tablet,  Take 20 mg by mouth daily., Disp: , Rfl:    levocetirizine (XYZAL) 5 MG tablet, Take 5 mg by mouth daily., Disp: , Rfl:    metFORMIN (GLUCOPHAGE) 500 MG tablet, Take 500 mg by mouth 2 (two) times daily with a meal., Disp: , Rfl:    Multiple Vitamin (MULTI-VITAMIN) tablet, Take 1 tablet by mouth in the morning and at bedtime., Disp: , Rfl:    Omega-3 Fatty Acids (FISH OIL) 1000 MG CAPS, Take 1,000 mg by mouth in the morning and at bedtime., Disp: , Rfl:    omeprazole (PRILOSEC) 20 MG capsule, Take 20 mg by mouth daily., Disp: , Rfl:    OXYCODONE ER PO, Take by mouth., Disp: , Rfl:    pregabalin  (LYRICA ) 150 MG capsule, Take 1 capsule (150 mg total) by mouth 2 (two) times daily., Disp: 60 capsule, Rfl: 3   simvastatin (ZOCOR) 40 MG tablet, Take 40 mg by mouth daily., Disp: , Rfl:    torsemide (DEMADEX) 10 MG tablet, Take 10 mg by mouth daily., Disp: , Rfl:   Allergies  Allergen Reactions   Gabapentin Other (See Comments)    Upset stomach    Clindamycin Nausea And Vomiting   Penicillins Rash   Review of Systems Objective:  There were no vitals filed for this visit.  General: Well developed, nourished, in no acute distress, alert and oriented x3   Dermatological: Skin is warm, dry and supple bilateral. Nails x 10 are well maintained; remaining integument appears unremarkable at this time. There are no open sores, no preulcerative lesions, no rash or signs of infection present.  Vascular: Dorsalis Pedis artery and Posterior Tibial artery pedal pulses are 2/4 bilateral with immedate capillary fill time. Pedal hair growth present. No varicosities and no lower extremity edema present bilateral.   Neruologic: Grossly intact via light touch bilateral. Vibratory intact via tuning fork bilateral. Protective threshold with Semmes Wienstein monofilament intact to all pedal sites bilateral. Patellar and Achilles deep tendon reflexes 2+ bilateral. No Babinski or clonus noted bilateral.    Musculoskeletal: No gross boney pedal deformities bilateral. No pain, crepitus, or limitation noted with foot and ankle range of motion bilateral. Muscular strength 5/5 in all groups tested bilateral.  Gait: Unassisted, Nonantalgic.    Radiographs:  None taken  Assessment & Plan:   Assessment: Diabetic peripheral neuropathy  Plan: Increased her Lyrica  today from 75 mg to 150 mg twice daily I will follow-up with her in 1 month for med check.     Revis Whalin T. [place], [place]

## 2024-01-11 ENCOUNTER — Ambulatory Visit (INDEPENDENT_AMBULATORY_CARE_PROVIDER_SITE_OTHER): Admitting: Podiatry

## 2024-01-11 ENCOUNTER — Encounter: Payer: Self-pay | Admitting: Podiatry

## 2024-01-11 DIAGNOSIS — R2689 Other abnormalities of gait and mobility: Secondary | ICD-10-CM | POA: Diagnosis not present

## 2024-01-11 DIAGNOSIS — R2681 Unsteadiness on feet: Secondary | ICD-10-CM | POA: Diagnosis not present

## 2024-01-11 DIAGNOSIS — E1142 Type 2 diabetes mellitus with diabetic polyneuropathy: Secondary | ICD-10-CM | POA: Diagnosis not present

## 2024-01-11 MED ORDER — PREGABALIN 150 MG PO CAPS: 150.0000 mg | ORAL_CAPSULE | Freq: Two times a day (BID) | ORAL | 5 refills | Status: DC

## 2024-01-11 NOTE — Progress Notes (Signed)
 She presents today for follow-up of her Lyrica she states that she is doing much better with the legs are not nearly as sensitive as they were prior to the 150 mg change.  She states that she is very happy with that outcome.  She states that she has noticed recently that she has started to lose her balance and that her gait is not the same.  Objective: Vital signs are stable alert and oriented x 3 watching her ambulate to the room she does have a fairly narrow short stride which could cause balance abnormalities.  I do think that she has much less pain on palpation of her legs than she did previously though.  No open lesions or wounds.  Assessment: Balance loss with insufficiency.  Gait abnormality.  And neuropathy.  Plan: I recommend highly she go to physical therapy with balance and gait training.  I also recommended that she continue the 150 mg twice daily.  May need to increase to 200 mg next visit twice daily.

## 2024-01-23 NOTE — Therapy (Incomplete)
 OUTPATIENT PHYSICAL THERAPY LOWER EXTREMITY EVALUATION   Patient Name: Meredith Cline MRN: [id number] DOB:01/09/1951, 73 y.o., female Today's Date: 01/23/2024  END OF SESSION:   Past Medical History:  Diagnosis Date   Anemia    Arthritis    Diabetes mellitus without complication (HCC)    GERD (gastroesophageal reflux disease)    Hypercholesterolemia    Hypertension    Sleep apnea    Past Surgical History:  Procedure Laterality Date   ABDOMINAL HYSTERECTOMY     BREAST REDUCTION SURGERY     CARPAL TUNNEL RELEASE     HERNIA REPAIR     NASAL SEPTUM SURGERY     REDUCTION MAMMAPLASTY     app. 1980   Patient Active Problem List   Diagnosis Date Noted   Anemia in chronic kidney disease (CKD) 05/18/2023   Deficiency anemia 03/28/2022   Excessive sweating 08/12/2021   Anemia 08/11/2020   B12 deficiency 08/11/2020   Nonintractable headache 02/25/2019   Presbycusis of both ears 02/25/2019   Sjogren's syndrome (HCC) 02/25/2019   Tinnitus of both ears 02/25/2019   Intraductal papilloma 12/08/2011    PCP: Jerel Gee, NP  REFERRING PROVIDER: Verta Royden DASEN, DPM  REFERRING DIAG:  R26.89 (ICD-10-CM) - Loss of balance  R26.81 (ICD-10-CM) - Unsteady gait when walking    THERAPY DIAG:  No diagnosis found.  Rationale for Evaluation and Treatment: Rehabilitation  ONSET DATE: ***  SUBJECTIVE:   SUBJECTIVE STATEMENT: ***  PERTINENT HISTORY: *** PAIN:  Are you having pain? {OPRCPAIN:27236}  PRECAUTIONS: {Therapy precautions:24002}  RED FLAGS: {PT Red Flags:29287}   WEIGHT BEARING RESTRICTIONS: {Yes ***/No:24003}  FALLS:  Has patient fallen in last 6 months? {fallsyesno:27318}  LIVING ENVIRONMENT: Lives with: {OPRC lives with:25569::lives with their family} Lives in: {Lives in:25570} Stairs: {opstairs:27293} Has following equipment at home: {Assistive devices:23999}  OCCUPATION: ***  PLOF: {PLOF:24004}  PATIENT GOALS: ***  NEXT MD VISIT:  ***  OBJECTIVE:  Note: Objective measures were completed at Evaluation unless otherwise noted.  DIAGNOSTIC FINDINGS: ***  PATIENT SURVEYS:  {rehab surveys:24030}  COGNITION: Overall cognitive status: {cognition:24006}     SENSATION: {sensation:27233}  EDEMA:  {edema:24020}  MUSCLE LENGTH: Hamstrings: Right *** deg; Left *** deg Debby test: Right *** deg; Left *** deg  POSTURE: {posture:25561}  PALPATION: ***  LOWER EXTREMITY ROM:  {AROM/PROM:27142} ROM Right eval Left eval  Hip flexion    Hip extension    Hip abduction    Hip adduction    Hip internal rotation    Hip external rotation    Knee flexion    Knee extension    Ankle dorsiflexion    Ankle plantarflexion    Ankle inversion    Ankle eversion     (Blank rows = not tested)  LOWER EXTREMITY MMT:  MMT Right eval Left eval  Hip flexion    Hip extension    Hip abduction    Hip adduction    Hip internal rotation    Hip external rotation    Knee flexion    Knee extension    Ankle dorsiflexion    Ankle plantarflexion    Ankle inversion    Ankle eversion     (Blank rows = not tested)  LOWER EXTREMITY SPECIAL TESTS:  {LEspecialtests:26242}  FUNCTIONAL TESTS:  {Functional tests:24029}  GAIT: Distance walked: *** Assistive device utilized: {Assistive devices:23999} Level of assistance: {Levels of assistance:24026} Comments: ***  TREATMENT DATE: ***    PATIENT EDUCATION:  Education details: *** Person educated: {Person educated:25204} Education method: {Education Method:25205} Education comprehension: {Education Comprehension:25206}  HOME EXERCISE PROGRAM: ***  ASSESSMENT:  CLINICAL IMPRESSION: Patient is a 73 y.o. female who was seen today for physical therapy evaluation and treatment for  R26.89 (ICD-10-CM) - Loss of balance  R26.81 (ICD-10-CM) -  Unsteady gait when walking  .   OBJECTIVE IMPAIRMENTS: {opptimpairments:25111}.   ACTIVITY LIMITATIONS: {activitylimitations:27494}  PARTICIPATION LIMITATIONS: {participationrestrictions:25113}  PERSONAL FACTORS: {Personal factors:25162} are also affecting patient's functional outcome.   REHAB POTENTIAL: {rehabpotential:25112}  CLINICAL DECISION MAKING: {clinical decision making:25114}  EVALUATION COMPLEXITY: {Evaluation complexity:25115}   GOALS:  SHORT TERM GOALS: Target date: *** *** Baseline: Goal status: INITIAL  2.  *** Baseline:  Goal status: INITIAL  3.  *** Baseline:  Goal status: INITIAL  4.  *** Baseline:  Goal status: INITIAL  5.  *** Baseline:  Goal status: INITIAL  6.  *** Baseline:  Goal status: INITIAL  LONG TERM GOALS: Target date: ***  *** Baseline:  Goal status: INITIAL  2.  *** Baseline:  Goal status: INITIAL  3.  *** Baseline:  Goal status: INITIAL  4.  *** Baseline:  Goal status: INITIAL  5.  *** Baseline:  Goal status: INITIAL  6.  *** Baseline:  Goal status: INITIAL   PLAN:  PT FREQUENCY: {rehab frequency:25116}  PT DURATION: {rehab duration:25117}  PLANNED INTERVENTIONS: {rehab planned interventions:25118::97110-Therapeutic exercises,97530- Therapeutic [phone]- Neuromuscular re-education,97535- Self Rjmz,02859- Manual therapy}  PLAN FOR NEXT SESSION: ***   Vicenta Olds, PT 01/23/2024, 10:06 AM

## 2024-01-24 ENCOUNTER — Other Ambulatory Visit: Payer: Self-pay

## 2024-01-24 ENCOUNTER — Encounter: Payer: Self-pay | Admitting: Physical Therapy

## 2024-01-24 ENCOUNTER — Ambulatory Visit

## 2024-01-24 ENCOUNTER — Ambulatory Visit: Attending: Podiatry | Admitting: Physical Therapy

## 2024-01-24 DIAGNOSIS — M5459 Other low back pain: Secondary | ICD-10-CM | POA: Insufficient documentation

## 2024-01-24 DIAGNOSIS — R2681 Unsteadiness on feet: Secondary | ICD-10-CM | POA: Diagnosis present

## 2024-01-24 DIAGNOSIS — M6281 Muscle weakness (generalized): Secondary | ICD-10-CM | POA: Diagnosis present

## 2024-01-24 NOTE — Therapy (Signed)
 OUTPATIENT PHYSICAL THERAPY THORACOLUMBAR EVALUATION  Patient Name: Meredith Cline MRN: [id number] DOB:1951/03/02, 73 y.o., female Today's Date: 01/24/2024   PT End of Session - 01/24/24 1153     Visit Number 1    Number of Visits --   1-2x/week   Date for PT Re-Evaluation 03/20/24    Authorization Type HTA    Progress Note Due on Visit 10    PT Start Time 1100    PT Stop Time 1140    PT Time Calculation (min) 40 min          Past Medical History:  Diagnosis Date   Anemia    Arthritis    Diabetes mellitus without complication (HCC)    GERD (gastroesophageal reflux disease)    Hypercholesterolemia    Hypertension    Sleep apnea    Past Surgical History:  Procedure Laterality Date   ABDOMINAL HYSTERECTOMY     BREAST REDUCTION SURGERY     CARPAL TUNNEL RELEASE     HERNIA REPAIR     NASAL SEPTUM SURGERY     REDUCTION MAMMAPLASTY     app. 1980   Patient Active Problem List   Diagnosis Date Noted   Anemia in chronic kidney disease (CKD) 05/18/2023   Deficiency anemia 03/28/2022   Excessive sweating 08/12/2021   Anemia 08/11/2020   B12 deficiency 08/11/2020   Nonintractable headache 02/25/2019   Presbycusis of both ears 02/25/2019   Sjogren's syndrome (HCC) 02/25/2019   Tinnitus of both ears 02/25/2019   Intraductal papilloma 12/08/2011    PCP: Jerel Gee, NP  REFERRING PROVIDER: Verta Royden DASEN, DPM  THERAPY DIAG:  Unsteadiness on feet - Plan: PT plan of care cert/re-cert  Muscle weakness - Plan: PT plan of care cert/re-cert  Other low back pain - Plan: PT plan of care cert/re-cert  REFERRING DIAG: balance  Rationale for Evaluation and Treatment:  Rehabilitation  SUBJECTIVE:  PERTINENT PAST HISTORY:  DM        PRECAUTIONS: None and Fall  WEIGHT BEARING RESTRICTIONS No  FALLS:  Has patient fallen in last 6 months? No, Number of falls: 0  MOI/History of condition:  Onset date: ~ 1 year  SUBJECTIVE STATEMENT  Pt is a 73 y.o. female who  presents to clinic with chief complaint of feeling of instability when walking.  She has neuropathy which causes constant n/t and some pain in her feet.  She has concurrent low back pain with bil radicular sxs.  (+) shopping cart sign.  Denies dizziness or light headedness.  Lumbar Spinal Stenosis Cluster Bilateral Symptoms   positive Leg pain > back pain   negative Pain during walking/standing  positive Pain relief w/ sitting   positive >48 y/o     positive  Pain:  Are you having pain? Yes Pain location: low back pain  NPRS scale:  Best: 2/10, Worst: 10/10 Aggravating factors: standing, walking Relieving factors: sitting, resting Pain description: sharp and aching  Occupation: NA  Assistive Device: no  Hand Dominance: na  Patient Goals/Specific Activities: reduce pain, be able to stand on feet for longer (5 min currently)   OBJECTIVE:   GENERAL OBSERVATION/GAIT: Antalgic gait, slow, WBOS, sudden listing to L while walking  LE MMT:  MMT Right (Eval) Left (Eval)  Hip flexion (L2, L3) 3+ 3+  Knee extension (L3) 3+ 3+  Knee flexion 3+ 5  Hip abduction 3 3  Hip extension    Hip external rotation    Hip internal rotation  Hip adduction    Ankle dorsiflexion (L4)    Ankle plantarflexion (S1) Unable to S/L HR Unable to S/L HR  Ankle inversion    Ankle eversion    Great Toe ext (L5)    Grossly     (Blank rows = not tested, score listed is out of 5 possible points OR may be listed in lbs of force.  N = WNL, D = diminished, C = clear for gross weakness with myotome testing, * = concordant pain with testing)  LE ROM:  ROM Right (Eval) Left (Eval)  Hip flexion    Hip extension    Hip abduction    Hip adduction    Hip internal rotation    Hip external rotation    Knee extension    Knee flexion    Ankle dorsiflexion    Ankle plantarflexion    Ankle inversion    Ankle eversion     (Blank rows = not tested, N = WNL, * = concordant pain with  testing)  Functional Tests  Eval    30'' STS: 5x  UE used? Y        Progressive balance screen (highest level completed for >/= 10''):  Feet together: 10'' unstable Semi Tandem: R in rear unable, L in rear unable     2 MWT: 293'                                              PATIENT SURVEYS:  Patient Specific Functional Scale:  Activity Eval         walking 6         Making bed 4         Sweeping floor 4                   Average 4.666          (Activities rated 0-10/10.  10 represents able to perform at prior level" while 0 represents "unable to perform." )  TODAY'S TREATMENT   Therapeutic Exercise: Creating, reviewing, and completing below HEP  PATIENT EDUCATION ([facility]ata/HM):  POC, diagnosis, prognosis, HEP, and outcome measures.  Pt educated via explanation, demonstration, and handout (HEP).  Pt confirms understanding verbally.   HOME EXERCISE PROGRAM: Access Code: [email or web address] URL: [email or web address] Date: 01/24/2024 Prepared by: Meredith Cline  Exercises - Seated Hip Abduction with Resistance  - 1 x daily - 7 x weekly - 3 sets - 10 reps - Seated Hip Adduction Isometrics with Ball  - 1 x daily - 7 x weekly - 1 sets - 10 reps - 10 hold - Sit to Stand with Counter Support  - 1 x daily - 7 x weekly - 2 sets - 5 reps  Treatment priorities   Eval        balance        General LE/hip/core strength        Recommended using AD for safety                          ASSESSMENT:  CLINICAL IMPRESSION: Meredith Cline is a 73 y.o. female who presents to clinic with signs and sxs consistent with balance and gait instability with concurrent low back pain.   Balance issues are likely d/t a combination of general weakness, neuropathy, and possibly lumbar stenosis.  (+) spinal stenosis  test cluster and signs consistent with central stenosis.   Meredith Cline will benefit from skilled PT to address relevant deficits and improve safety and independence with functional  mobility.   OBJECTIVE IMPAIRMENTS: Pain, LE/hip/core/ankle strength, gait, balance  ACTIVITY LIMITATIONS: standing, walking, lifting, squatting, housework, self care, grocery shopping  PERSONAL FACTORS: See medical history and pertinent history   REHAB POTENTIAL: Good  CLINICAL DECISION MAKING: Evolving/moderate complexity  EVALUATION COMPLEXITY: Moderate   GOALS:   SHORT TERM GOALS: Target date: 02/21/2024  Meredith Cline will be >75% HEP compliant to improve carryover between sessions and facilitate independent management of condition  Evaluation: ongoing Goal status: INITIAL   LONG TERM GOALS: Target date: 03/20/2024   Meredith Cline will report confidence in maintenance of balance at time of discharge with advanced HEP  Evaluation/Baseline: unable to self manage Goal status: INITIAL   2.  Meredith Cline will be able to stand for >30'' in semi tandem stance, to show a significant improvement in balance in order to reduce fall risk   Evaluation/Baseline: unable Goal status: INITIAL   3.  Meredith Cline will improve 30'' STS (MCID 2) to >/= 5x (w/ UE?: n) to show improved LE strength and improved transfers   Evaluation/Baseline: 5x  w/ UE? y Goal status: INITIAL   4.  Meredith Cline will improve two minute walk test to 350 feet (MCID 40 ft).  Evaluation/Baseline: 293 ft Goal status: INITIAL   5.  Meredith Cline will show a >/= 2 pt improvement in their average PSFS score as a proxy for functional improvement   Evaluation/Baseline:  Patient Specific Functional Scale:  Activity Eval         walking 6         Making bed 4         Sweeping floor 4                   Average 4.666          (Activities rated 0-10/10.  10 represents able to perform at prior level" while 0 represents "unable to perform." )  Minimum detectable change (90%CI) for average score = 2 points Minimum detectable change (90%CI) for single activity score = 3 points   Goal status: INITIAL    PLAN: PT FREQUENCY:  1-2x/week  PT DURATION: 8 weeks  PLANNED INTERVENTIONS: Therapeutic exercises, Aquatic therapy, Therapeutic activity, Neuro Muscular re-education, Gait training, Patient/Family education, Joint mobilization, Dry Needling, Electrical stimulation, Spinal mobilization and/or manipulation, Moist heat, Taping, Vasopneumatic device, Ionotophoresis 4mg /ml Dexamethasone, and Manual therapy   Meredith Cline PT, DPT 01/24/2024, 11:55 AM

## 2024-01-24 NOTE — Patient Instructions (Signed)
 Aquatic Therapy at [facility]-  What to Expect!  Where:   [facility] [address] [place], [place] [address] Rehab phone [phone]  NOTE:  You will receive an automated phone message reminding you of your appt and it will say the appointment is at the [address] [facility] clinic.          How to Prepare: Please make sure you drink 8 ounces of water about one hour prior to your pool session A caregiver may attend if needed with the patient to help assist as needed. A caregiver can sit in the pool room on chair. Please arrive IN YOUR SUIT and 15 minutes prior to your appointment - this helps to avoid delays in starting your session. Please make sure to attend to any toileting needs prior to entering the pool [place] rooms for changing are provided.   There is direct access to the pool deck form the locker room.  You can lock your belongings in a locker with lock provided. Once on the pool deck your therapist will ask if you have signed the Patient  Consent and Assignment of Benefits form before beginning treatment Your therapist may take your blood pressure prior to, during and after your session if indicated We usually try and create a home exercise program based on activities we do in the pool.  Please be thinking about who might be able to assist you in the pool should you need to participate in an aquatic home exercise program at the time of discharge if you need assistance.  Some patients do not want to or do not have the ability to participate in an aquatic home program - this is not a barrier in any way to you participating in aquatic therapy as part of your current therapy plan! After Discharge from PT, you can continue using home program at  the [facility], there is a drop-in fee for $5 ($45 a month)or for 60 years  or older $4.00 ($40 a month for seniors ) or any local [employer] pool.  Memberships for purchase are  available for gym/pool at [facility]  IT IS VERY IMPORTANT THAT YOUR LAST VISIT BE IN THE CLINIC AT [facility] [address] AFTER YOUR LAST AQUATIC VISIT.  PLEASE MAKE SURE THAT YOU HAVE A [address]  APPOINTMENT SCHEDULED.   About the pool: Pool is located approximately 500 FT from the entrance of the building.  Please bring a support person if you need assistance traveling this      distance.   Your therapist will assist you in entering the water; there are two ways to           enter: stairs with railings, and a mechanical lift. Your therapist will determine the most appropriate way for you.  Water temperature is usually between 88-90 degrees  There may be up to 2 other swimmers in the pool at the same time  The pool deck is tile, please wear shoes with good traction if you prefer not to be barefoot.    Contact Info:  For appointment scheduling and cancellations:         Please call the [facility]  PH:[phone]              Aquatic Therapy  Outpatient Rehabilitation @ [facility]       All sessions are 45 minutes

## 2024-01-30 ENCOUNTER — Ambulatory Visit: Attending: Podiatry | Admitting: Physical Therapy

## 2024-01-30 ENCOUNTER — Encounter: Payer: Self-pay | Admitting: Physical Therapy

## 2024-01-30 DIAGNOSIS — M5459 Other low back pain: Secondary | ICD-10-CM | POA: Diagnosis present

## 2024-01-30 DIAGNOSIS — R2681 Unsteadiness on feet: Secondary | ICD-10-CM | POA: Diagnosis present

## 2024-01-30 DIAGNOSIS — M6281 Muscle weakness (generalized): Secondary | ICD-10-CM | POA: Diagnosis present

## 2024-01-30 NOTE — Therapy (Signed)
 OUTPATIENT PHYSICAL THERAPY DAILY NOTE  Patient Name: Meredith Cline MRN: [id number] DOB:1950-06-13, 73 y.o., female Today's Date: 01/30/2024   PT End of Session - 01/30/24 1059     Visit Number 2    Number of Visits --   1-2x/week   Date for PT Re-Evaluation 03/20/24    Authorization Type HTA    Progress Note Due on Visit 10    PT Start Time 1100    PT Stop Time 1142    PT Time Calculation (min) 42 min          Past Medical History:  Diagnosis Date   Anemia    Arthritis    Diabetes mellitus without complication (HCC)    GERD (gastroesophageal reflux disease)    Hypercholesterolemia    Hypertension    Sleep apnea    Past Surgical History:  Procedure Laterality Date   ABDOMINAL HYSTERECTOMY     BREAST REDUCTION SURGERY     CARPAL TUNNEL RELEASE     HERNIA REPAIR     NASAL SEPTUM SURGERY     REDUCTION MAMMAPLASTY     app. 1980   Patient Active Problem List   Diagnosis Date Noted   Anemia in chronic kidney disease (CKD) 05/18/2023   Deficiency anemia 03/28/2022   Excessive sweating 08/12/2021   Anemia 08/11/2020   B12 deficiency 08/11/2020   Nonintractable headache 02/25/2019   Presbycusis of both ears 02/25/2019   Sjogren's syndrome (HCC) 02/25/2019   Tinnitus of both ears 02/25/2019   Intraductal papilloma 12/08/2011    PCP: Meredith Gee, NP  REFERRING PROVIDER: Verta Royden Cline, DPM  THERAPY DIAG:  Unsteadiness on feet  Muscle weakness  Other low back pain  REFERRING DIAG: balance  Rationale for Evaluation and Treatment:  Rehabilitation  SUBJECTIVE:  PERTINENT PAST HISTORY:  DM        PRECAUTIONS: None and Fall  WEIGHT BEARING RESTRICTIONS No  FALLS:  Has patient fallen in last 6 months? No, Number of falls: 0  MOI/History of condition:  Onset date: ~ 1 year  SUBJECTIVE STATEMENT  Pt reports HEP compliance.  Her back pain is about the same.    Pain:  Are you having pain? Yes Pain location: low back pain  NPRS scale:  Best: 2/10,  Worst: 10/10 Aggravating factors: standing, walking Relieving factors: sitting, resting Pain description: sharp and aching  Occupation: NA  Assistive Device: no  Hand Dominance: na  Patient Goals/Specific Activities: reduce pain, be able to stand on feet for longer (5 min currently)   OBJECTIVE:   GENERAL OBSERVATION/GAIT: Antalgic gait, slow, WBOS, sudden listing to L while walking  LE MMT:  MMT Right (Eval) Left (Eval)  Hip flexion (L2, L3) 3+ 3+  Knee extension (L3) 3+ 3+  Knee flexion 3+ 5  Hip abduction 3 3  Hip extension    Hip external rotation    Hip internal rotation    Hip adduction    Ankle dorsiflexion (L4)    Ankle plantarflexion (S1) Unable to S/L HR Unable to S/L HR  Ankle inversion    Ankle eversion    Great Toe ext (L5)    Grossly     (Blank rows = not tested, score listed is out of 5 possible points OR may be listed in lbs of force.  N = WNL, D = diminished, C = clear for gross weakness with myotome testing, * = concordant pain with testing)  LE ROM:  ROM Right (Eval) Left (Eval)  Hip  flexion    Hip extension    Hip abduction    Hip adduction    Hip internal rotation    Hip external rotation    Knee extension    Knee flexion    Ankle dorsiflexion    Ankle plantarflexion    Ankle inversion    Ankle eversion     (Blank rows = not tested, N = WNL, * = concordant pain with testing)  Functional Tests  Eval    30'' STS: 5x  UE used? Y        Progressive balance screen (highest level completed for >/= 10''):  Feet together: 10'' unstable Semi Tandem: R in rear unable, L in rear unable     2 MWT: 293'                                              PATIENT SURVEYS:  Patient Specific Functional Scale:  Activity Eval         walking 6         Making bed 4         Sweeping floor 4                   Average 4.666          (Activities rated 0-10/10.  10 represents able to perform at prior level" while 0 represents  "unable to perform." )  TODAY'S TREATMENT  [facility] Adult PT Treatment  01/30/2024:  Therapeutic Exercise: nu-step L5 91m while taking subjective and planning session with patient LAQ - 2# - 3x10 HS curl - GTB - 2x10  Therapeutic Activity  Standing heel raise - 2x10 Standing hip abd - 2x10 Sit to stand - 5x5 - raised table  Neuromuscular re-ed: FT on foam March on foam w/ UE support Rocker board DF/PF w/ UE support - 2x15    HOME EXERCISE PROGRAM: Access Code: [email or web address] URL: [email or web address] Date: 01/30/2024 Prepared by: Meredith Cline  Exercises - Seated Hip Abduction with Resistance  - 1 x daily - 7 x weekly - 3 sets - 10 reps - Seated Hip Adduction Isometrics with Ball  - 1 x daily - 7 x weekly - 1 sets - 10 reps - 10 hold - Sit to Stand with Counter Support  - 1 x daily - 7 x weekly - 3 sets - 5 reps - Standing Hip Abduction with Counter Support  - 1 x daily - 7 x weekly - 2 sets - 10 reps - Heel Raises with Counter Support  - 1 x daily - 7 x weekly - 2 sets - 10 reps  Treatment priorities   Eval        balance        General LE/hip/core strength        Recommended using AD for safety                          ASSESSMENT:  CLINICAL IMPRESSION:  01/30/2024:  Meredith Cline tolerated session well with no adverse reaction.  Worked on balance combined with hip and LE strengthening.  Integrated seated rest breaks and exercise d/t back pain with prolonged standing exercises.  Pt reports muscular fatigue with listed strengthening.  Pt challenged with FT on foam but able to complete with minimal use of UE.  Will progress as  able.  EVAL: Meredith Cline is a 73 y.o. female who presents to clinic with signs and sxs consistent with balance and gait instability with concurrent low back pain.   Balance issues are likely d/t a combination of general weakness, neuropathy, and possibly lumbar stenosis.  (+) spinal stenosis test cluster and signs consistent with central stenosis.    Meredith Cline will benefit from skilled PT to address relevant deficits and improve safety and independence with functional mobility.   OBJECTIVE IMPAIRMENTS: Pain, LE/hip/core/ankle strength, gait, balance  ACTIVITY LIMITATIONS: standing, walking, lifting, squatting, housework, self care, grocery shopping  PERSONAL FACTORS: See medical history and pertinent history   REHAB POTENTIAL: Good  CLINICAL DECISION MAKING: Evolving/moderate complexity  EVALUATION COMPLEXITY: Moderate   GOALS:   SHORT TERM GOALS: Target date: 02/21/2024  Jamaira will be >75% HEP compliant to improve carryover between sessions and facilitate independent management of condition  Evaluation: ongoing Goal status: INITIAL   LONG TERM GOALS: Target date: 03/20/2024   Shanora will report confidence in maintenance of balance at time of discharge with advanced HEP  Evaluation/Baseline: unable to self manage Goal status: INITIAL   2.  Kaylean will be able to stand for >30'' in semi tandem stance, to show a significant improvement in balance in order to reduce fall risk   Evaluation/Baseline: unable Goal status: INITIAL   3.  Niamya will improve 30'' STS (MCID 2) to >/= 5x (w/ UE?: n) to show improved LE strength and improved transfers   Evaluation/Baseline: 5x  w/ UE? y Goal status: INITIAL   4.  Auna will improve two minute walk test to 350 feet (MCID 40 ft).  Evaluation/Baseline: 293 ft Goal status: INITIAL   5.  Trease will show a >/= 2 pt improvement in their average PSFS score as a proxy for functional improvement   Evaluation/Baseline:  Patient Specific Functional Scale:  Activity Eval         walking 6         Making bed 4         Sweeping floor 4                   Average 4.666          (Activities rated 0-10/10.  10 represents able to perform at prior level" while 0 represents "unable to perform." )  Minimum detectable change (90%CI) for average score = 2 points Minimum detectable  change (90%CI) for single activity score = 3 points   Goal status: INITIAL    PLAN: PT FREQUENCY: 1-2x/week  PT DURATION: 8 weeks  PLANNED INTERVENTIONS: Therapeutic exercises, Aquatic therapy, Therapeutic activity, Neuro Muscular re-education, Gait training, Patient/Family education, Joint mobilization, Dry Needling, Electrical stimulation, Spinal mobilization and/or manipulation, Moist heat, Taping, Vasopneumatic device, Ionotophoresis 4mg /ml Dexamethasone, and Manual therapy   Levaughn Puccinelli PT, DPT 01/30/2024, 11:48 AM

## 2024-02-07 ENCOUNTER — Ambulatory Visit: Admitting: Physical Therapy

## 2024-02-07 ENCOUNTER — Encounter: Payer: Self-pay | Admitting: Physical Therapy

## 2024-02-07 DIAGNOSIS — R2681 Unsteadiness on feet: Secondary | ICD-10-CM

## 2024-02-07 DIAGNOSIS — M6281 Muscle weakness (generalized): Secondary | ICD-10-CM

## 2024-02-07 NOTE — Therapy (Signed)
 OUTPATIENT PHYSICAL THERAPY DAILY NOTE  Patient Name: ANNSLEY AKKERMAN MRN: [id number] DOB:1950-07-26, 73 y.o., female Today's Date: 02/07/2024   PT End of Session - 02/07/24 0923     Visit Number 3    Number of Visits --   1-2x/week   Date for PT Re-Evaluation 03/20/24    Authorization Type HTA    Progress Note Due on Visit 10    PT Start Time 0926    PT Stop Time 1012    PT Time Calculation (min) 46 min          Past Medical History:  Diagnosis Date   Anemia    Arthritis    Diabetes mellitus without complication (HCC)    GERD (gastroesophageal reflux disease)    Hypercholesterolemia    Hypertension    Sleep apnea    Past Surgical History:  Procedure Laterality Date   ABDOMINAL HYSTERECTOMY     BREAST REDUCTION SURGERY     CARPAL TUNNEL RELEASE     HERNIA REPAIR     NASAL SEPTUM SURGERY     REDUCTION MAMMAPLASTY     app. 1980   Patient Active Problem List   Diagnosis Date Noted   Anemia in chronic kidney disease (CKD) 05/18/2023   Deficiency anemia 03/28/2022   Excessive sweating 08/12/2021   Anemia 08/11/2020   B12 deficiency 08/11/2020   Nonintractable headache 02/25/2019   Presbycusis of both ears 02/25/2019   Sjogren's syndrome (HCC) 02/25/2019   Tinnitus of both ears 02/25/2019   Intraductal papilloma 12/08/2011    PCP: Jerel Gee, NP  REFERRING PROVIDER: Verta Royden DASEN, DPM  THERAPY DIAG:  Unsteadiness on feet  Muscle weakness  REFERRING DIAG: balance  Rationale for Evaluation and Treatment:  Rehabilitation  SUBJECTIVE:  PERTINENT PAST HISTORY:  DM        PRECAUTIONS: None and Fall  WEIGHT BEARING RESTRICTIONS No  FALLS:  Has patient fallen in last 6 months? No, Number of falls: 0  MOI/History of condition:  Onset date: ~ 1 year  SUBJECTIVE STATEMENT  Pt reports HEP compliance.  Her back pain is about the same.    Pain:  Are you having pain? Yes Pain location: low back pain  NPRS scale: 0/10  Best: 0/10, Worst:  10/10 Aggravating factors: standing, walking Relieving factors: sitting, resting Pain description: sharp and aching  Occupation: NA  Assistive Device: no  Hand Dominance: na  Patient Goals/Specific Activities: reduce pain, be able to stand on feet for longer (5 min currently)   OBJECTIVE:   GENERAL OBSERVATION/GAIT: Antalgic gait, slow, WBOS, sudden listing to L while walking  LE MMT:  MMT Right (Eval) Left (Eval)  Hip flexion (L2, L3) 3+ 3+  Knee extension (L3) 3+ 3+  Knee flexion 3+ 5  Hip abduction 3 3  Hip extension    Hip external rotation    Hip internal rotation    Hip adduction    Ankle dorsiflexion (L4)    Ankle plantarflexion (S1) Unable to S/L HR Unable to S/L HR  Ankle inversion    Ankle eversion    Great Toe ext (L5)    Grossly     (Blank rows = not tested, score listed is out of 5 possible points OR may be listed in lbs of force.  N = WNL, D = diminished, C = clear for gross weakness with myotome testing, * = concordant pain with testing)  LE ROM:  ROM Right (Eval) Left (Eval)  Hip flexion  Hip extension    Hip abduction    Hip adduction    Hip internal rotation    Hip external rotation    Knee extension    Knee flexion    Ankle dorsiflexion    Ankle plantarflexion    Ankle inversion    Ankle eversion     (Blank rows = not tested, N = WNL, * = concordant pain with testing)  Functional Tests  Eval    30'' STS: 5x  UE used? Y        Progressive balance screen (highest level completed for >/= 10''):  Feet together: 10'' unstable Semi Tandem: R in rear unable, L in rear unable     2 MWT: 293'                                              PATIENT SURVEYS:  Patient Specific Functional Scale:  Activity Eval         walking 6         Making bed 4         Sweeping floor 4                   Average 4.666          (Activities rated 0-10/10.  10 represents able to perform at prior level" while 0 represents "unable  to perform." )  TODAY'S TREATMENT  [facility] Adult PT Treatment:                                                DATE: 02/07/24 Therapeutic Exercise: Nustep L5 5 m  Seated lumbar flexion with ball x 10 LAQ - 3# - 3x10 Seated h/s curls 10 x 2 each GTB  Neuromuscular re-ed: FT on foam Semi tandem on foam -each leg back  March on Foam with UE support  Therapeutic Activity: Heel raise 10 x 2  Alternating hip flexion x 10 each  Standing hip abduction 10 x 2 each  STS 5 x 2 chair with Foam pad to elevate      [facility] Adult PT Treatment  01/30/2024:  Therapeutic Exercise: nu-step L5 21m while taking subjective and planning session with patient LAQ - 2# - 3x10 HS curl - GTB - 2x10  Therapeutic Activity  Standing heel raise - 2x10 Standing hip abd - 2x10 Sit to stand - 5x5 - raised table  Neuromuscular re-ed: FT on foam March on foam w/ UE support Rocker board DF/PF w/ UE support - 2x15    HOME EXERCISE PROGRAM: Access Code: [email or web address] URL: [email or web address] Date: 01/30/2024 Prepared by: Helene Gasmen  Exercises - Seated Hip Abduction with Resistance  - 1 x daily - 7 x weekly - 3 sets - 10 reps - Seated Hip Adduction Isometrics with Ball  - 1 x daily - 7 x weekly - 1 sets - 10 reps - 10 hold - Sit to Stand with Counter Support  - 1 x daily - 7 x weekly - 3 sets - 5 reps - Standing Hip Abduction with Counter Support  - 1 x daily - 7 x weekly - 2 sets - 10 reps - Heel Raises with Counter Support  - 1 x daily - 7 x  weekly - 2 sets - 10 reps  Treatment priorities   Eval        balance        General LE/hip/core strength        Recommended using AD for safety                          ASSESSMENT:  CLINICAL IMPRESSION:  02/07/2024:  [name] tolerated session well with no adverse reaction.  Worked on balance combined with hip and LE strengthening.  Integrated seated rest breaks and repeated lumbar flexion to reduce lumbar pain. Pt reports muscular fatigue with  listed strengthening.  Pt challenged with semi tandem  on foam but able to complete with minimal use of UE.  Will progress as able.  EVAL: Kensy is a 73 y.o. female who presents to clinic with signs and sxs consistent with balance and gait instability with concurrent low back pain.   Balance issues are likely d/t a combination of general weakness, neuropathy, and possibly lumbar stenosis.  (+) spinal stenosis test cluster and signs consistent with central stenosis.   Renea will benefit from skilled PT to address relevant deficits and improve safety and independence with functional mobility.   OBJECTIVE IMPAIRMENTS: Pain, LE/hip/core/ankle strength, gait, balance  ACTIVITY LIMITATIONS: standing, walking, lifting, squatting, housework, self care, grocery shopping  PERSONAL FACTORS: See medical history and pertinent history   REHAB POTENTIAL: Good  CLINICAL DECISION MAKING: Evolving/moderate complexity  EVALUATION COMPLEXITY: Moderate   GOALS:   SHORT TERM GOALS: Target date: 02/21/2024  Kaleeya will be >75% HEP compliant to improve carryover between sessions and facilitate independent management of condition  Evaluation: ongoing Goal status: INITIAL   LONG TERM GOALS: Target date: 03/20/2024   Gabreille will report confidence in maintenance of balance at time of discharge with advanced HEP  Evaluation/Baseline: unable to self manage Goal status: INITIAL   2.  Sarah-Jane will be able to stand for >30'' in semi tandem stance, to show a significant improvement in balance in order to reduce fall risk   Evaluation/Baseline: unable Goal status: INITIAL   3.  Citlalic will improve 30'' STS (MCID 2) to >/= 5x (w/ UE?: n) to show improved LE strength and improved transfers   Evaluation/Baseline: 5x  w/ UE? y Goal status: INITIAL   4.  Alyannah will improve two minute walk test to 350 feet (MCID 40 ft).  Evaluation/Baseline: 293 ft Goal status: INITIAL   5.  Cartha will show a >/= 2 pt  improvement in their average PSFS score as a proxy for functional improvement   Evaluation/Baseline:  Patient Specific Functional Scale:  Activity Eval         walking 6         Making bed 4         Sweeping floor 4                   Average 4.666          (Activities rated 0-10/10.  10 represents able to perform at prior level" while 0 represents "unable to perform." )  Minimum detectable change (90%CI) for average score = 2 points Minimum detectable change (90%CI) for single activity score = 3 points   Goal status: INITIAL    PLAN: PT FREQUENCY: 1-2x/week  PT DURATION: 8 weeks  PLANNED INTERVENTIONS: Therapeutic exercises, Aquatic therapy, Therapeutic activity, Neuro Muscular re-education, Gait training, Patient/Family education, Joint mobilization, Dry Needling, Electrical stimulation, Spinal  mobilization and/or manipulation, Moist heat, Taping, Vasopneumatic device, Ionotophoresis 4mg /ml Dexamethasone, and Manual therapy   Harlene Persons, PTA 02/07/24 11:01 AM Phone: [phone] Fax: [phone]

## 2024-02-12 ENCOUNTER — Encounter: Payer: Self-pay | Admitting: Physical Therapy

## 2024-02-12 ENCOUNTER — Ambulatory Visit: Admitting: Physical Therapy

## 2024-02-12 DIAGNOSIS — R2681 Unsteadiness on feet: Secondary | ICD-10-CM

## 2024-02-12 DIAGNOSIS — M5459 Other low back pain: Secondary | ICD-10-CM

## 2024-02-12 DIAGNOSIS — M6281 Muscle weakness (generalized): Secondary | ICD-10-CM

## 2024-02-12 NOTE — Therapy (Signed)
 OUTPATIENT PHYSICAL THERAPY DAILY NOTE  Patient Name: Meredith Cline MRN: [id number] DOB:10-Nov-1950, 73 y.o., female Today's Date: 02/12/2024   PT End of Session - 02/12/24 1054     Visit Number 4    Number of Visits --   1-2x/week   Date for PT Re-Evaluation 03/20/24    Authorization Type HTA    Progress Note Due on Visit 10    PT Start Time 1100    PT Stop Time 1145    PT Time Calculation (min) 45 min          Past Medical History:  Diagnosis Date   Anemia    Arthritis    Diabetes mellitus without complication (HCC)    GERD (gastroesophageal reflux disease)    Hypercholesterolemia    Hypertension    Sleep apnea    Past Surgical History:  Procedure Laterality Date   ABDOMINAL HYSTERECTOMY     BREAST REDUCTION SURGERY     CARPAL TUNNEL RELEASE     HERNIA REPAIR     NASAL SEPTUM SURGERY     REDUCTION MAMMAPLASTY     app. 1980   Patient Active Problem List   Diagnosis Date Noted   Anemia in chronic kidney disease (CKD) 05/18/2023   Deficiency anemia 03/28/2022   Excessive sweating 08/12/2021   Anemia 08/11/2020   B12 deficiency 08/11/2020   Nonintractable headache 02/25/2019   Presbycusis of both ears 02/25/2019   Sjogren's syndrome (HCC) 02/25/2019   Tinnitus of both ears 02/25/2019   Intraductal papilloma 12/08/2011    PCP: Jerel Gee, NP  REFERRING PROVIDER: Jerel Gee, NP  THERAPY DIAG:  Unsteadiness on feet  Muscle weakness  Other low back pain  REFERRING DIAG: balance  Rationale for Evaluation and Treatment:  Rehabilitation  SUBJECTIVE:  PERTINENT PAST HISTORY:  DM        PRECAUTIONS: None and Fall  WEIGHT BEARING RESTRICTIONS No  FALLS:  Has patient fallen in last 6 months? No, Number of falls: 0  MOI/History of condition:  Onset date: ~ 1 year  SUBJECTIVE STATEMENT  Pt reports HEP compliance. Back and shoulders are sore from the exercises.   Pain:  Are you having pain? Yes Pain location: low back pain  NPRS scale:  0/10  Best: 0/10, Worst: 10/10 Aggravating factors: standing, walking Relieving factors: sitting, resting Pain description: sharp and aching  Occupation: NA  Assistive Device: no  Hand Dominance: na  Patient Goals/Specific Activities: reduce pain, be able to stand on feet for longer (5 min currently)   OBJECTIVE:   GENERAL OBSERVATION/GAIT: Antalgic gait, slow, WBOS, sudden listing to L while walking  LE MMT:  MMT Right (Eval) Left (Eval)  Hip flexion (L2, L3) 3+ 3+  Knee extension (L3) 3+ 3+  Knee flexion 3+ 5  Hip abduction 3 3  Hip extension    Hip external rotation    Hip internal rotation    Hip adduction    Ankle dorsiflexion (L4)    Ankle plantarflexion (S1) Unable to S/L HR Unable to S/L HR  Ankle inversion    Ankle eversion    Great Toe ext (L5)    Grossly     (Blank rows = not tested, score listed is out of 5 possible points OR may be listed in lbs of force.  N = WNL, D = diminished, C = clear for gross weakness with myotome testing, * = concordant pain with testing)  LE ROM:  ROM Right (Eval) Left (Eval)  Hip flexion  Hip extension    Hip abduction    Hip adduction    Hip internal rotation    Hip external rotation    Knee extension    Knee flexion    Ankle dorsiflexion    Ankle plantarflexion    Ankle inversion    Ankle eversion     (Blank rows = not tested, N = WNL, * = concordant pain with testing)  Functional Tests  Eval    30'' STS: 5x  UE used? Y        Progressive balance screen (highest level completed for >/= 10''):  Feet together: 10'' unstable Semi Tandem: R in rear unable, L in rear unable     2 MWT: 293'                                              PATIENT SURVEYS:  Patient Specific Functional Scale:  Activity Eval         walking 6         Making bed 4         Sweeping floor 4                   Average 4.666          (Activities rated 0-10/10.  10 represents able to perform at prior level"  while 0 represents "unable to perform." )  TODAY'S TREATMENT  [facility] Adult PT Treatment:                                                DATE: 02/12/24 Therapeutic Exercise: Seated Lumbar flexion with ball  Nustep L4 x 5 minutes UE/LE  SKTC LTR  Banded bridge - small rom H/s curls seated 15 x 2 GTB each  LAQ 4# 15 x 2 each  Heel raises x 20 Neuromuscular re-ed: Semi tandem with head turns/ nods  Tandem stance with UE support Therapeutic Activity: Heel raises at counter  Side stepping at counter Retro walking at counter     [facility] Adult PT Treatment:                                                DATE: 02/07/24 Therapeutic Exercise: Nustep L5 5 m  Seated lumbar flexion with ball x 10 LAQ - 3# - 3x10 Seated h/s curls 10 x 2 each GTB  Neuromuscular re-ed: FT on foam Semi tandem on foam -each leg back  March on Foam with UE support  Therapeutic Activity: Heel raise 10 x 2  Alternating hip flexion x 10 each  Standing hip abduction 10 x 2 each  STS 5 x 2 chair with Foam pad to elevate      [facility] Adult PT Treatment  01/30/2024:  Therapeutic Exercise: nu-step L5 48m while taking subjective and planning session with patient LAQ - 2# - 3x10 HS curl - GTB - 2x10  Therapeutic Activity  Standing heel raise - 2x10 Standing hip abd - 2x10 Sit to stand - 5x5 - raised table  Neuromuscular re-ed: FT on foam March on foam w/ UE support Rocker board  DF/PF w/ UE support - 2x15    HOME EXERCISE PROGRAM: Access Code: [email or web address] URL: [email or web address] Date: 01/30/2024 Prepared by: Helene Gasmen  Exercises - Seated Hip Abduction with Resistance  - 1 x daily - 7 x weekly - 3 sets - 10 reps - Seated Hip Adduction Isometrics with Ball  - 1 x daily - 7 x weekly - 1 sets - 10 reps - 10 hold - Sit to Stand with Counter Support  - 1 x daily - 7 x weekly - 3 sets - 5 reps - Standing Hip Abduction with Counter Support  - 1 x daily - 7 x weekly - 2 sets - 10 reps -  Heel Raises with Counter Support  - 1 x daily - 7 x weekly - 2 sets - 10 reps  Treatment priorities   Eval        balance        General LE/hip/core strength        Recommended using AD for safety                          ASSESSMENT:  CLINICAL IMPRESSION:  02/12/2024:  Meredith Cline tolerated session well with no adverse reaction.  She is completing HEP and feeling soreness in her back and shoulders. Continued to worked on balance combined with hip and LE strengthening.  Integrated seated rest breaks and repeated lumbar flexion to reduce lumbar pain. Pt reports muscular fatigue with listed strengthening.  Pt challenged with semi tandem on foam but able to complete with minimal use of UE.  Will progress as able.  EVAL: Dorlis is a 73 y.o. female who presents to clinic with signs and sxs consistent with balance and gait instability with concurrent low back pain.   Balance issues are likely d/t a combination of general weakness, neuropathy, and possibly lumbar stenosis.  (+) spinal stenosis test cluster and signs consistent with central stenosis.   Paisely will benefit from skilled PT to address relevant deficits and improve safety and independence with functional mobility.   OBJECTIVE IMPAIRMENTS: Pain, LE/hip/core/ankle strength, gait, balance  ACTIVITY LIMITATIONS: standing, walking, lifting, squatting, housework, self care, grocery shopping  PERSONAL FACTORS: See medical history and pertinent history   REHAB POTENTIAL: Good  CLINICAL DECISION MAKING: Evolving/moderate complexity  EVALUATION COMPLEXITY: Moderate   GOALS:   SHORT TERM GOALS: Target date: 02/21/2024  Caron will be >75% HEP compliant to improve carryover between sessions and facilitate independent management of condition  Evaluation: ongoing Goal status: INITIAL   LONG TERM GOALS: Target date: 03/20/2024   Tiasha will report confidence in maintenance of balance at time of discharge with advanced  HEP  Evaluation/Baseline: unable to self manage Goal status: INITIAL   2.  Malaisha will be able to stand for >30'' in semi tandem stance, to show a significant improvement in balance in order to reduce fall risk   Evaluation/Baseline: unable Goal status: INITIAL   3.  Berdie will improve 30'' STS (MCID 2) to >/= 5x (w/ UE?: n) to show improved LE strength and improved transfers   Evaluation/Baseline: 5x  w/ UE? y Goal status: INITIAL   4.  Jesica will improve two minute walk test to 350 feet (MCID 40 ft).  Evaluation/Baseline: 293 ft Goal status: INITIAL   5.  Vasiliki will show a >/= 2 pt improvement in their average PSFS score as a proxy for functional improvement   Evaluation/Baseline:  Patient Specific Functional Scale:  Activity Eval  walking 6         Making bed 4         Sweeping floor 4                   Average 4.666          (Activities rated 0-10/10.  10 represents able to perform at prior level" while 0 represents "unable to perform." )  Minimum detectable change (90%CI) for average score = 2 points Minimum detectable change (90%CI) for single activity score = 3 points   Goal status: INITIAL    PLAN: PT FREQUENCY: 1-2x/week  PT DURATION: 8 weeks  PLANNED INTERVENTIONS: Therapeutic exercises, Aquatic therapy, Therapeutic activity, Neuro Muscular re-education, Gait training, Patient/Family education, Joint mobilization, Dry Needling, Electrical stimulation, Spinal mobilization and/or manipulation, Moist heat, Taping, Vasopneumatic device, Ionotophoresis 4mg /ml Dexamethasone, and Manual therapy   Harlene Persons, PTA 02/12/24 12:19 PM Phone: [phone] Fax: [phone]

## 2024-02-14 ENCOUNTER — Ambulatory Visit: Admitting: Physical Therapy

## 2024-02-19 ENCOUNTER — Ambulatory Visit: Admitting: Physical Therapy

## 2024-02-19 ENCOUNTER — Encounter: Payer: Self-pay | Admitting: Physical Therapy

## 2024-02-19 DIAGNOSIS — R2681 Unsteadiness on feet: Secondary | ICD-10-CM

## 2024-02-19 DIAGNOSIS — M5459 Other low back pain: Secondary | ICD-10-CM

## 2024-02-19 DIAGNOSIS — M6281 Muscle weakness (generalized): Secondary | ICD-10-CM

## 2024-02-19 NOTE — Therapy (Signed)
 OUTPATIENT PHYSICAL THERAPY DAILY NOTE  Patient Name: ARLYN BUMPUS MRN: [id number] DOB:06-Feb-1951, 73 y.o., female Today's Date: 02/19/2024   PT End of Session - 02/19/24 1051     Visit Number 5    Number of Visits --   1-2x/week   Date for Recertification  03/20/24    Authorization Type HTA    Progress Note Due on Visit 10    PT Start Time 1052    PT Stop Time 1135    PT Time Calculation (min) 43 min          Past Medical History:  Diagnosis Date   Anemia    Arthritis    Diabetes mellitus without complication (HCC)    GERD (gastroesophageal reflux disease)    Hypercholesterolemia    Hypertension    Sleep apnea    Past Surgical History:  Procedure Laterality Date   ABDOMINAL HYSTERECTOMY     BREAST REDUCTION SURGERY     CARPAL TUNNEL RELEASE     HERNIA REPAIR     NASAL SEPTUM SURGERY     REDUCTION MAMMAPLASTY     app. 1980   Patient Active Problem List   Diagnosis Date Noted   Anemia in chronic kidney disease (CKD) 05/18/2023   Deficiency anemia 03/28/2022   Excessive sweating 08/12/2021   Anemia 08/11/2020   B12 deficiency 08/11/2020   Nonintractable headache 02/25/2019   Presbycusis of both ears 02/25/2019   Sjogren's syndrome (HCC) 02/25/2019   Tinnitus of both ears 02/25/2019   Intraductal papilloma 12/08/2011    PCP: Jerel Gee, NP  REFERRING PROVIDER: Verta Royden DASEN, DPM  THERAPY DIAG:  Unsteadiness on feet  Muscle weakness  Other low back pain  REFERRING DIAG: balance  Rationale for Evaluation and Treatment:  Rehabilitation  SUBJECTIVE:  PERTINENT PAST HISTORY:  DM        PRECAUTIONS: None and Fall  WEIGHT BEARING RESTRICTIONS No  FALLS:  Has patient fallen in last 6 months? No, Number of falls: 0  MOI/History of condition:  Onset date: ~ 1 year  SUBJECTIVE STATEMENT  Pt reports HEP compliance. Back and shoulders are sore from the exercises. No pain with sitting, 6-7/10 with walking.   Pain:  Are you having pain?  Yes Pain location: low back pain  NPRS scale: 0/10  Best: 0/10, Worst: 10/10 Aggravating factors: standing, walking Relieving factors: sitting, resting Pain description: sharp and aching  Occupation: NA  Assistive Device: no  Hand Dominance: na  Patient Goals/Specific Activities: reduce pain, be able to stand on feet for longer (5 min currently)   OBJECTIVE:   GENERAL OBSERVATION/GAIT: Antalgic gait, slow, WBOS, sudden listing to L while walking  LE MMT:  MMT Right (Eval) Left (Eval)  Hip flexion (L2, L3) 3+ 3+  Knee extension (L3) 3+ 3+  Knee flexion 3+ 5  Hip abduction 3 3  Hip extension    Hip external rotation    Hip internal rotation    Hip adduction    Ankle dorsiflexion (L4)    Ankle plantarflexion (S1) Unable to S/L HR Unable to S/L HR  Ankle inversion    Ankle eversion    Great Toe ext (L5)    Grossly     (Blank rows = not tested, score listed is out of 5 possible points OR may be listed in lbs of force.  N = WNL, D = diminished, C = clear for gross weakness with myotome testing, * = concordant pain with testing)  LE ROM:  ROM Right (Eval) Left (Eval)  Hip flexion    Hip extension    Hip abduction    Hip adduction    Hip internal rotation    Hip external rotation    Knee extension    Knee flexion    Ankle dorsiflexion    Ankle plantarflexion    Ankle inversion    Ankle eversion     (Blank rows = not tested, N = WNL, * = concordant pain with testing)  Functional Tests  Eval 02/19/24   30'' STS: 5x  UE used? Y 9x  No UE       Progressive balance screen (highest level completed for >/= 10''):  Feet together: 10'' unstable Semi Tandem: R in rear unable, L in rear unable      Feet together:30 or more Semi tandem: Right in rear 30 Left in rear 30    2 MWT: 293'                                              PATIENT SURVEYS:  Patient Specific Functional Scale:  Activity Eval         walking 6         Making bed 4          Sweeping floor 4                   Average 4.666          (Activities rated 0-10/10.  10 represents able to perform at prior level" while 0 represents "unable to perform." )  TODAY'S TREATMENT  [facility] Adult PT Treatment:                                                DATE: 02/19/24 Therapeutic Exercise: Seated Lumbar flexion with ball  Nustep L4 x 5 minutes UE/LE   Neuromuscular re-ed: Gait with head turns Gait with Nods FT 30-45 sec Semi tandem 30 sec each, mild instability, no LOB Therapeutic Activity: Gait with SPC right hand vs left hand , CGA     [facility] Adult PT Treatment:                                                DATE: 02/12/24 Therapeutic Exercise: Seated Lumbar flexion with ball  Nustep L4 x 5 minutes UE/LE  SKTC LTR  Banded bridge - small rom H/s curls seated 15 x 2 GTB each  LAQ 4# 15 x 2 each  Heel raises x 20 Neuromuscular re-ed: Semi tandem with head turns/ nods  Tandem stance with UE support Therapeutic Activity: Heel raises at counter  Side stepping at counter Retro walking at counter     [facility] Adult PT Treatment:                                                DATE: 02/07/24 Therapeutic Exercise: Nustep L5 5 m  Seated lumbar flexion with ball x 10 LAQ -  3# - 3x10 Seated h/s curls 10 x 2 each GTB  Neuromuscular re-ed: FT on foam Semi tandem on foam -each leg back  March on Foam with UE support  Therapeutic Activity: Heel raise 10 x 2  Alternating hip flexion x 10 each  Standing hip abduction 10 x 2 each  STS 5 x 2 chair with Foam pad to elevate      [facility] Adult PT Treatment  01/30/2024:  Therapeutic Exercise: nu-step L5 74m while taking subjective and planning session with patient LAQ - 2# - 3x10 HS curl - GTB - 2x10  Therapeutic Activity  Standing heel raise - 2x10 Standing hip abd - 2x10 Sit to stand - 5x5 - raised table  Neuromuscular re-ed: FT on foam March on foam w/ UE support Rocker board DF/PF w/ UE support -  2x15    HOME EXERCISE PROGRAM: Access Code: [email or web address] URL: [email or web address] Date: 01/30/2024 Prepared by: Helene Gasmen  Exercises - Seated Hip Abduction with Resistance  - 1 x daily - 7 x weekly - 3 sets - 10 reps - Seated Hip Adduction Isometrics with Ball  - 1 x daily - 7 x weekly - 1 sets - 10 reps - 10 hold - Sit to Stand with Counter Support  - 1 x daily - 7 x weekly - 3 sets - 5 reps - Standing Hip Abduction with Counter Support  - 1 x daily - 7 x weekly - 2 sets - 10 reps - Heel Raises with Counter Support  - 1 x daily - 7 x weekly - 2 sets - 10 reps  Treatment priorities   Eval        balance        General LE/hip/core strength        Recommended using AD for safety                          ASSESSMENT:  CLINICAL IMPRESSION:  02/19/2024:  [name] tolerated session well with no adverse reaction.  She is completing HEP and feeling more soreness in her back and shoulders. She reports no significant improvement in her balance with daily function and it is worse in the morning, causing her to furniture walk or hold on the the walls. She does not use SPC due to her reports of lack of coordination. Time spent using clinic [facility] with pt feeling better using RUE due to left shoulder pain and weakness. Worked on head turns with gait and pt did well but ambulates slowly. Her back pain also increased in closed chain and reduced after sitting rest. Her 30 sec STS has improved. Also, her narrow stance and semi tandem stance has improved. She will be seen in aquatics next visit to assess benefit. Will progress as able.  EVAL: Lita is a 73 y.o. female who presents to clinic with signs and sxs consistent with balance and gait instability with concurrent low back pain.   Balance issues are likely d/t a combination of general weakness, neuropathy, and possibly lumbar stenosis.  (+) spinal stenosis test cluster and signs consistent with central stenosis.   Kenny will benefit  from skilled PT to address relevant deficits and improve safety and independence with functional mobility.   OBJECTIVE IMPAIRMENTS: Pain, LE/hip/core/ankle strength, gait, balance  ACTIVITY LIMITATIONS: standing, walking, lifting, squatting, housework, self care, grocery shopping  PERSONAL FACTORS: See medical history and pertinent history   REHAB POTENTIAL: Good  CLINICAL DECISION MAKING: Evolving/moderate complexity  EVALUATION COMPLEXITY: Moderate   GOALS:   SHORT TERM GOALS: Target date: 02/21/2024  Carey will be >75% HEP compliant to improve carryover between sessions and facilitate independent management of condition  Evaluation: ongoing Goal status: MET   LONG TERM GOALS: Target date: 03/20/2024   Desteni will report confidence in maintenance of balance at time of discharge with advanced HEP  Evaluation/Baseline: unable to self manage Goal status: INITIAL   2.  Carmencita will be able to stand for >30'' in semi tandem stance, to show a significant improvement in balance in order to reduce fall risk   Evaluation/Baseline: unable Goal status: INITIAL   3.  Shondrea will improve 30'' STS (MCID 2) to >/= 5x (w/ UE?: n) to show improved LE strength and improved transfers   Evaluation/Baseline: 5x  w/ UE? y Goal status: INITIAL   4.  Malinda will improve two minute walk test to 350 feet (MCID 40 ft).  Evaluation/Baseline: 293 ft Goal status: INITIAL   5.  Seda will show a >/= 2 pt improvement in their average PSFS score as a proxy for functional improvement   Evaluation/Baseline:  Patient Specific Functional Scale:  Activity Eval         walking 6         Making bed 4         Sweeping floor 4                   Average 4.666          (Activities rated 0-10/10.  10 represents able to perform at prior level" while 0 represents "unable to perform." )  Minimum detectable change (90%CI) for average score = 2 points Minimum detectable change (90%CI) for single  activity score = 3 points   Goal status: INITIAL    PLAN: PT FREQUENCY: 1-2x/week  PT DURATION: 8 weeks  PLANNED INTERVENTIONS: Therapeutic exercises, Aquatic therapy, Therapeutic activity, Neuro Muscular re-education, Gait training, Patient/Family education, Joint mobilization, Dry Needling, Electrical stimulation, Spinal mobilization and/or manipulation, Moist heat, Taping, Vasopneumatic device, Ionotophoresis 4mg /ml Dexamethasone, and Manual therapy   Harlene Persons, PTA 02/19/24 11:40 AM Phone: [phone] Fax: [phone]

## 2024-02-21 ENCOUNTER — Encounter: Payer: Self-pay | Admitting: Physical Therapy

## 2024-02-21 ENCOUNTER — Ambulatory Visit: Admitting: Physical Therapy

## 2024-02-21 DIAGNOSIS — M6281 Muscle weakness (generalized): Secondary | ICD-10-CM

## 2024-02-21 DIAGNOSIS — R2681 Unsteadiness on feet: Secondary | ICD-10-CM

## 2024-02-21 DIAGNOSIS — M5459 Other low back pain: Secondary | ICD-10-CM

## 2024-02-21 NOTE — Therapy (Signed)
 OUTPATIENT PHYSICAL THERAPY DAILY NOTE  Patient Name: Meredith Cline MRN: [id number] DOB:[birth year], 73 y.o., female Today's Date: 02/21/2024   PT End of Session - 02/21/24 1244     Visit Number 6    Number of Visits --   1-2x/week   Date for Recertification  03/20/24    Authorization Type HTA    Progress Note Due on Visit 10    PT Start Time 1245    PT Stop Time 1326    PT Time Calculation (min) 41 min           Past Medical History:  Diagnosis Date   Anemia    Arthritis    Diabetes mellitus without complication (HCC)    GERD (gastroesophageal reflux disease)    Hypercholesterolemia    Hypertension    Sleep apnea    Past Surgical History:  Procedure Laterality Date   ABDOMINAL HYSTERECTOMY     BREAST REDUCTION SURGERY     CARPAL TUNNEL RELEASE     HERNIA REPAIR     NASAL SEPTUM SURGERY     REDUCTION MAMMAPLASTY     app. 1980   Patient Active Problem List   Diagnosis Date Noted   Anemia in chronic kidney disease (CKD) 05/18/2023   Deficiency anemia 03/28/2022   Excessive sweating 08/12/2021   Anemia 08/11/2020   B12 deficiency 08/11/2020   Nonintractable headache 02/25/2019   Presbycusis of both ears 02/25/2019   Sjogren's syndrome 02/25/2019   Tinnitus of both ears 02/25/2019   Intraductal papilloma 12/08/2011    PCP: Jerel Gee, NP  REFERRING PROVIDER: Verta Royden DASEN, DPM  THERAPY DIAG:  Unsteadiness on feet  Muscle weakness  Other low back pain  REFERRING DIAG: balance  Rationale for Evaluation and Treatment:  Rehabilitation  SUBJECTIVE:  PERTINENT PAST HISTORY:  DM        PRECAUTIONS: None and Fall  WEIGHT BEARING RESTRICTIONS No  FALLS:  Has patient fallen in last 6 months? No, Number of falls: 0  MOI/History of condition:  Onset date: ~ 1 year  SUBJECTIVE STATEMENT  Pt reports minimal improvement so far.  Pain:  Are you having pain? Yes Pain location: low back pain  NPRS scale: 0/10  Best: 0/10, Worst:  10/10 Aggravating factors: standing, walking Relieving factors: sitting, resting Pain description: sharp and aching  Occupation: NA  Assistive Device: no  Hand Dominance: na  Patient Goals/Specific Activities: reduce pain, be able to stand on feet for longer (5 min currently)   OBJECTIVE:   GENERAL OBSERVATION/GAIT: Antalgic gait, slow, WBOS, sudden listing to L while walking  LE MMT:  MMT Right (Eval) Left (Eval)  Hip flexion (L2, L3) 3+ 3+  Knee extension (L3) 3+ 3+  Knee flexion 3+ 5  Hip abduction 3 3  Hip extension    Hip external rotation    Hip internal rotation    Hip adduction    Ankle dorsiflexion (L4)    Ankle plantarflexion (S1) Unable to S/L HR Unable to S/L HR  Ankle inversion    Ankle eversion    Great Toe ext (L5)    Grossly     (Blank rows = not tested, score listed is out of 5 possible points OR may be listed in lbs of force.  N = WNL, D = diminished, C = clear for gross weakness with myotome testing, * = concordant pain with testing)  LE ROM:  ROM Right (Eval) Left (Eval)  Hip flexion    Hip extension  Hip abduction    Hip adduction    Hip internal rotation    Hip external rotation    Knee extension    Knee flexion    Ankle dorsiflexion    Ankle plantarflexion    Ankle inversion    Ankle eversion     (Blank rows = not tested, N = WNL, * = concordant pain with testing)  Functional Tests  Eval 02/19/24   30'' STS: 5x  UE used? Y 9x  No UE       Progressive balance screen (highest level completed for >/= 10''):  Feet together: 10'' unstable Semi Tandem: R in rear unable, L in rear unable      Feet together:30 or more Semi tandem: Right in rear 30 Left in rear 30    2 MWT: 293'                                              PATIENT SURVEYS:  Patient Specific Functional Scale:  Activity Eval         walking 6         Making bed 4         Sweeping floor 4                   Average 4.666           (Activities rated 0-10/10.  10 represents able to perform at prior level" while 0 represents "unable to perform." )  TODAY'S TREATMENT   TREATMENT 02/21/24:  Aquatic therapy at [facility]- [address] - therapeutic pool temp 92 degrees Pt enters building independently.  Treatment took place in water 3.8 to  4 ft 8 in.feet deep depending upon activity.  Pt entered and exited the pool via stair and handrails    Aquatic Therapy:  Water walking for warm up fwd/lat/bkwds  Squats Heel raises Hip abd Step ups - fwd and lat Standing march Hip hinge to wall NBOS walking Farmers carry - YDB STS  Bil shoulder ext - YDB  alternating  Pt requires the buoyancy of water for active assisted exercises with buoyancy supported for strengthening and AROM exercises. Hydrostatic pressure also supports joints by unweighting joint load by at least 50 % in 3-4 feet depth water. 80% in chest to neck deep water. Water will provide assistance with movement using the current and laminar flow while the buoyancy reduces weight bearing. Pt requires the viscosity of the water for resistance with strengthening exercises.  [facility] Adult PT Treatment:                                                DATE: 02/19/24 Therapeutic Exercise: Seated Lumbar flexion with ball  Nustep L4 x 5 minutes UE/LE   Neuromuscular re-ed: Gait with head turns Gait with Nods FT 30-45 sec Semi tandem 30 sec each, mild instability, no LOB Therapeutic Activity: Gait with SPC right hand vs left hand , CGA     [facility] Adult PT Treatment:  DATE: 02/12/24 Therapeutic Exercise: Seated Lumbar flexion with ball  Nustep L4 x 5 minutes UE/LE  SKTC LTR  Banded bridge - small rom H/s curls seated 15 x 2 GTB each  LAQ 4# 15 x 2 each  Heel raises x 20 Neuromuscular re-ed: Semi tandem with head turns/ nods  Tandem stance with UE support Therapeutic Activity: Heel raises at counter   Side stepping at counter Retro walking at counter     [facility] Adult PT Treatment:                                                DATE: 02/07/24 Therapeutic Exercise: Nustep L5 5 m  Seated lumbar flexion with ball x 10 LAQ - 3# - 3x10 Seated h/s curls 10 x 2 each GTB  Neuromuscular re-ed: FT on foam Semi tandem on foam -each leg back  March on Foam with UE support  Therapeutic Activity: Heel raise 10 x 2  Alternating hip flexion x 10 each  Standing hip abduction 10 x 2 each  STS 5 x 2 chair with Foam pad to elevate      [facility] Adult PT Treatment  01/30/2024:  Therapeutic Exercise: nu-step L5 42m while taking subjective and planning session with patient LAQ - 2# - 3x10 HS curl - GTB - 2x10  Therapeutic Activity  Standing heel raise - 2x10 Standing hip abd - 2x10 Sit to stand - 5x5 - raised table  Neuromuscular re-ed: FT on foam March on foam w/ UE support Rocker board DF/PF w/ UE support - 2x15    HOME EXERCISE PROGRAM: Access Code: [email or web address] URL: [email or web address] Date: 01/30/2024 Prepared by: Helene Gasmen  Exercises - Seated Hip Abduction with Resistance  - 1 x daily - 7 x weekly - 3 sets - 10 reps - Seated Hip Adduction Isometrics with Ball  - 1 x daily - 7 x weekly - 1 sets - 10 reps - 10 hold - Sit to Stand with Counter Support  - 1 x daily - 7 x weekly - 3 sets - 5 reps - Standing Hip Abduction with Counter Support  - 1 x daily - 7 x weekly - 2 sets - 10 reps - Heel Raises with Counter Support  - 1 x daily - 7 x weekly - 2 sets - 10 reps  Treatment priorities   Eval 9/24       balance ''       General LE/hip/core strength ''       Recommended using AD for safety Encouraged starting aquatic therapy and/or chair exercise classes at [facility] or similar                         ASSESSMENT:  CLINICAL IMPRESSION:  02/21/2024:  Session today focused on core and hip strengthening as well as balance in the aquatic environment for use of  buoyancy to offload joints and the viscosity of water as resistance during therapeutic exercise.  Pt with excellent exercise tolerance in pool vs land; recommended she pursue  aquatic exercise independently and provided pool list.  Patient was able to tolerate all prescribed exercises in the aquatic environment with no adverse effects and reports no increase pain at the end of the session. Patient continues to benefit from skilled PT services on land and aquatic based and should be progressed  as able to improve functional independence.   EVAL: Anum is a 73 y.o. female who presents to clinic with signs and sxs consistent with balance and gait instability with concurrent low back pain.   Balance issues are likely d/t a combination of general weakness, neuropathy, and possibly lumbar stenosis.  (+) spinal stenosis test cluster and signs consistent with central stenosis.   Everline will benefit from skilled PT to address relevant deficits and improve safety and independence with functional mobility.   OBJECTIVE IMPAIRMENTS: Pain, LE/hip/core/ankle strength, gait, balance  ACTIVITY LIMITATIONS: standing, walking, lifting, squatting, housework, self care, grocery shopping  PERSONAL FACTORS: See medical history and pertinent history   REHAB POTENTIAL: Good  CLINICAL DECISION MAKING: Evolving/moderate complexity  EVALUATION COMPLEXITY: Moderate   GOALS:   SHORT TERM GOALS: Target date: 02/21/2024  Haddy will be >75% HEP compliant to improve carryover between sessions and facilitate independent management of condition  Evaluation: ongoing Goal status: MET   LONG TERM GOALS: Target date: 03/20/2024   Mykhia will report confidence in maintenance of balance at time of discharge with advanced HEP  Evaluation/Baseline: unable to self manage Goal status: INITIAL   2.  Willean will be able to stand for >30'' in semi tandem stance, to show a significant improvement in balance in order to reduce fall  risk   Evaluation/Baseline: unable Goal status: INITIAL   3.  Jamela will improve 30'' STS (MCID 2) to >/= 5x (w/ UE?: n) to show improved LE strength and improved transfers   Evaluation/Baseline: 5x  w/ UE? y Goal status: INITIAL   4.  Heidee will improve two minute walk test to 350 feet (MCID 40 ft).  Evaluation/Baseline: 293 ft Goal status: INITIAL   5.  Surah will show a >/= 2 pt improvement in their average PSFS score as a proxy for functional improvement   Evaluation/Baseline:  Patient Specific Functional Scale:  Activity Eval         walking 6         Making bed 4         Sweeping floor 4                   Average 4.666          (Activities rated 0-10/10.  10 represents able to perform at prior level" while 0 represents "unable to perform." )  Minimum detectable change (90%CI) for average score = 2 points Minimum detectable change (90%CI) for single activity score = 3 points   Goal status: INITIAL    PLAN: PT FREQUENCY: 1-2x/week  PT DURATION: 8 weeks  PLANNED INTERVENTIONS: Therapeutic exercises, Aquatic therapy, Therapeutic activity, Neuro Muscular re-education, Gait training, Patient/Family education, Joint mobilization, Dry Needling, Electrical stimulation, Spinal mobilization and/or manipulation, Moist heat, Taping, Vasopneumatic device, Ionotophoresis 4mg /ml Dexamethasone, and Manual therapy   Helene BRAVO Airabella Barley PT 02/21/24 1:30 PM Phone: [phone] Fax: [phone]

## 2024-02-22 ENCOUNTER — Ambulatory Visit: Admitting: Podiatry

## 2024-02-22 ENCOUNTER — Ambulatory Visit (INDEPENDENT_AMBULATORY_CARE_PROVIDER_SITE_OTHER): Admitting: Podiatry

## 2024-02-22 ENCOUNTER — Encounter: Payer: Self-pay | Admitting: Podiatry

## 2024-02-22 DIAGNOSIS — E1142 Type 2 diabetes mellitus with diabetic polyneuropathy: Secondary | ICD-10-CM

## 2024-02-22 MED ORDER — PREGABALIN 200 MG PO CAPS: 200.0000 mg | ORAL_CAPSULE | Freq: Two times a day (BID) | ORAL | 3 refills | Status: AC

## 2024-02-26 ENCOUNTER — Telehealth: Payer: Self-pay

## 2024-02-26 NOTE — Progress Notes (Signed)
 She presents today for follow-up of her diabetic peripheral neuropathy states that she is already taking 150 mg of Lyrica  and they are still tingling but not as bad.  She states that she is having no side effects taking the medication.  Objective: No change in physical exam pulses are palpable.  No open lesions or wounds.  Assessment: Diabetic peripheral neuropathy.  Plan: Increased her Lyrica  200 mg twice daily.  Follow-up with her in 1 month or so.

## 2024-02-26 NOTE — Telephone Encounter (Signed)
 Spoke to patient to offer 11:45 with Jessica D on Thursday, patient declined. Accepted 10/9 at 11am with Dasie R as she accidentally cancelled her 10:15am from MyChart on this day.   Corean Pouch, PTA 02/26/24 11:57 AM

## 2024-03-05 ENCOUNTER — Ambulatory Visit: Admitting: Physical Therapy

## 2024-03-06 NOTE — Therapy (Signed)
 OUTPATIENT PHYSICAL THERAPY DAILY NOTE  Patient Name: Meredith Cline MRN: [id number] DOB:18-Aug-1950, 73 y.o., female Today's Date: 03/07/2024   PT End of Session - 03/07/24 1107     Visit Number 7    Number of Visits --   1-2x per week   Date for Recertification  03/20/24    Authorization Type HTA    Progress Note Due on Visit 10    PT Start Time 1105    PT Stop Time 1150    PT Time Calculation (min) 45 min    Activity Tolerance Patient tolerated treatment well    Behavior During Therapy WFL for tasks assessed/performed            Past Medical History:  Diagnosis Date   Anemia    Arthritis    Diabetes mellitus without complication (HCC)    GERD (gastroesophageal reflux disease)    Hypercholesterolemia    Hypertension    Sleep apnea    Past Surgical History:  Procedure Laterality Date   ABDOMINAL HYSTERECTOMY     BREAST REDUCTION SURGERY     CARPAL TUNNEL RELEASE     HERNIA REPAIR     NASAL SEPTUM SURGERY     REDUCTION MAMMAPLASTY     app. 1980   Patient Active Problem List   Diagnosis Date Noted   Anemia in chronic kidney disease (CKD) 05/18/2023   Deficiency anemia 03/28/2022   Excessive sweating 08/12/2021   Anemia 08/11/2020   B12 deficiency 08/11/2020   Nonintractable headache 02/25/2019   Presbycusis of both ears 02/25/2019   Sjogren's syndrome 02/25/2019   Tinnitus of both ears 02/25/2019   Intraductal papilloma 12/08/2011    PCP: Jerel Gee, NP  REFERRING PROVIDER: Verta Royden DASEN, DPM  THERAPY DIAG:  Unsteadiness on feet  Muscle weakness  Other low back pain  REFERRING DIAG: balance  Rationale for Evaluation and Treatment:  Rehabilitation  SUBJECTIVE:  PERTINENT PAST HISTORY:  DM        PRECAUTIONS: None and Fall  WEIGHT BEARING RESTRICTIONS No  FALLS:  Has patient fallen in last 6 months? No, Number of falls: 0  MOI/History of condition:  Onset date: ~ 1 year  SUBJECTIVE STATEMENT Pt reports she Is not listing to the R  as much as she was prior to PT. Pt states she did not feel insync with using the cane the last PT session.  Pain:  Are you having pain? Yes Pain location: low back pain  NPRS scale: 0/10  Best: 0/10, Worst: 10/10 Aggravating factors: standing, walking Relieving factors: sitting, resting Pain description: sharp and aching  Occupation: NA  Assistive Device: no  Hand Dominance: na  Patient Goals/Specific Activities: reduce pain, be able to stand on feet for longer (5 min currently)   OBJECTIVE:   GENERAL OBSERVATION/GAIT: Antalgic gait, slow, WBOS, sudden listing to L while walking  LE MMT:  MMT Right (Eval) Left (Eval)  Hip flexion (L2, L3) 3+ 3+  Knee extension (L3) 3+ 3+  Knee flexion 3+ 5  Hip abduction 3 3  Hip extension    Hip external rotation    Hip internal rotation    Hip adduction    Ankle dorsiflexion (L4)    Ankle plantarflexion (S1) Unable to S/L HR Unable to S/L HR  Ankle inversion    Ankle eversion    Great Toe ext (L5)    Grossly     (Blank rows = not tested, score listed is out of 5 possible points OR  may be listed in lbs of force.  N = WNL, D = diminished, C = clear for gross weakness with myotome testing, * = concordant pain with testing)  LE ROM:  ROM Right (Eval) Left (Eval)  Hip flexion    Hip extension    Hip abduction    Hip adduction    Hip internal rotation    Hip external rotation    Knee extension    Knee flexion    Ankle dorsiflexion    Ankle plantarflexion    Ankle inversion    Ankle eversion     (Blank rows = not tested, N = WNL, * = concordant pain with testing)  Functional Tests  Eval 02/19/24   30'' STS: 5x  UE used? Y 9x  No UE       Progressive balance screen (highest level completed for >/= 10''):  Feet together: 10'' unstable Semi Tandem: R in rear unable, L in rear unable      Feet together:30 or more Semi tandem: Right in rear 30 Left in rear 30    2 MWT: 293'                                               PATIENT SURVEYS:  Patient Specific Functional Scale:  Activity Eval         walking 6         Making bed 4         Sweeping floor 4                   Average 4.666          (Activities rated 0-10/10.  10 represents able to perform at prior level" while 0 represents "unable to perform." )  TODAY'S TREATMENT  [facility] Adult PT Treatment:                                                DATE: 03/07/24 Therapeutic Exercise/Therapeutic Activity: Seated Lumbar flexion with ball Gait with SPC in the right hand, left hand and without the [facility] 185' each STS 2x10  Side steps c GTB along counter Heel raises x 20 Marching on airex SL standing  TREATMENT 02/21/24: Aquatic therapy at [facility]- [address] - therapeutic pool temp 92 degrees Pt enters building independently.  Treatment took place in water 3.8 to  4 ft 8 in.feet deep depending upon activity.  Pt entered and exited the pool via stair and handrails    Aquatic Therapy:  Water walking for warm up fwd/lat/bkwds  Squats Heel raises Hip abd Step ups - fwd and lat Standing march Hip hinge to wall NBOS walking Farmers carry - YDB STS  Bil shoulder ext - YDB  alternating  Pt requires the buoyancy of water for active assisted exercises with buoyancy supported for strengthening and AROM exercises. Hydrostatic pressure also supports joints by unweighting joint load by at least 50 % in 3-4 feet depth water. 80% in chest to neck deep water. Water will provide assistance with movement using the current and laminar flow while the buoyancy reduces weight bearing. Pt requires the viscosity of the water for resistance with strengthening exercises.  [facility] Adult PT Treatment:  DATE: 02/19/24 Therapeutic Exercise: Seated Lumbar flexion with ball  Nustep L4 x 5 minutes UE/LE   Neuromuscular re-ed: Gait with head turns Gait with Nods FT 30-45 sec Semi tandem 30  sec each, mild instability, no LOB Therapeutic Activity: Gait with SPC right hand vs left hand , CGA     [facility] Adult PT Treatment:                                                DATE: 02/12/24 Therapeutic Exercise: Seated Lumbar flexion with ball  Nustep L4 x 5 minutes UE/LE  SKTC LTR  Banded bridge - small rom H/s curls seated 15 x 2 GTB each  LAQ 4# 15 x 2 each  Heel raises x 20 Neuromuscular re-ed: Semi tandem with head turns/ nods  Tandem stance with UE support Therapeutic Activity: Heel raises at counter  Side stepping at counter Retro walking at counter  HOME EXERCISE PROGRAM: Access Code: [email or web address] URL: [email or web address] Date: 01/30/2024 Prepared by: Helene Gasmen  Exercises - Seated Hip Abduction with Resistance  - 1 x daily - 7 x weekly - 3 sets - 10 reps - Seated Hip Adduction Isometrics with Ball  - 1 x daily - 7 x weekly - 1 sets - 10 reps - 10 hold - Sit to Stand with Counter Support  - 1 x daily - 7 x weekly - 3 sets - 5 reps - Standing Hip Abduction with Counter Support  - 1 x daily - 7 x weekly - 2 sets - 10 reps - Heel Raises with Counter Support  - 1 x daily - 7 x weekly - 2 sets - 10 reps  Treatment priorities   Eval 9/24       balance ''       General LE/hip/core strength ''       Recommended using AD for safety Encouraged starting aquatic therapy and/or chair exercise classes at [facility] or similar                         ASSESSMENT:  CLINICAL IMPRESSION: PT was completed for gait training with a SPC, balance, and lower body strengthening. With use of the [facility] in the R hand, pt demonstrated improved quality of gait with minimized lateral corrective steps. Pt was encouraged to use her SPC outside of her home. Pt tolerated prescribed exs in PT today without adverse effects. Pt will continue to benefit from skilled PT to address impairments for improved function with minimized pain.   EVAL: Meredith Cline is a 73 y.o. female who presents to  clinic with signs and sxs consistent with balance and gait instability with concurrent low back pain.   Balance issues are likely d/t a combination of general weakness, neuropathy, and possibly lumbar stenosis.  (+) spinal stenosis test cluster and signs consistent with central stenosis.   Meredith Cline will benefit from skilled PT to address relevant deficits and improve safety and independence with functional mobility.   OBJECTIVE IMPAIRMENTS: Pain, LE/hip/core/ankle strength, gait, balance  ACTIVITY LIMITATIONS: standing, walking, lifting, squatting, housework, self care, grocery shopping  PERSONAL FACTORS: See medical history and pertinent history   REHAB POTENTIAL: Good  CLINICAL DECISION MAKING: Evolving/moderate complexity  EVALUATION COMPLEXITY: Moderate   GOALS:   SHORT TERM GOALS: Target date: 02/21/2024  Minal will be >75% HEP compliant to improve  carryover between sessions and facilitate independent management of condition  Evaluation: ongoing Goal status: MET   LONG TERM GOALS: Target date: 03/20/2024   Meredith Cline will report confidence in maintenance of balance at time of discharge with advanced HEP  Evaluation/Baseline: unable to self manage Goal status: INITIAL   2.  Meredith Cline will be able to stand for >30'' in semi tandem stance, to show a significant improvement in balance in order to reduce fall risk   Evaluation/Baseline: unable Goal status: INITIAL   3.  Meredith Cline will improve 30'' STS (MCID 2) to >/= 5x (w/ UE?: n) to show improved LE strength and improved transfers   Evaluation/Baseline: 5x  w/ UE? y Goal status: INITIAL   4.  Meredith Cline will improve two minute walk test to 350 feet (MCID 40 ft).  Evaluation/Baseline: 293 ft Goal status: INITIAL   5.  Meredith Cline will show a >/= 2 pt improvement in their average PSFS score as a proxy for functional improvement   Evaluation/Baseline:  Patient Specific Functional Scale:  Activity Eval         walking 6         Making  bed 4         Sweeping floor 4                   Average 4.666          (Activities rated 0-10/10.  10 represents able to perform at prior level" while 0 represents "unable to perform." )  Minimum detectable change (90%CI) for average score = 2 points Minimum detectable change (90%CI) for single activity score = 3 points   Goal status: INITIAL    PLAN: PT FREQUENCY: 1-2x/week  PT DURATION: 8 weeks  PLANNED INTERVENTIONS: Therapeutic exercises, Aquatic therapy, Therapeutic activity, Neuro Muscular re-education, Gait training, Patient/Family education, Joint mobilization, Dry Needling, Electrical stimulation, Spinal mobilization and/or manipulation, Moist heat, Taping, Vasopneumatic device, Ionotophoresis 4mg /ml Dexamethasone, and Manual therapy   Motty Borin PT 03/07/24 1:02 PM Phone: [phone] Fax: [phone]

## 2024-03-07 ENCOUNTER — Inpatient Hospital Stay: Admitting: Internal Medicine

## 2024-03-07 ENCOUNTER — Ambulatory Visit: Payer: Self-pay | Attending: Podiatry

## 2024-03-07 ENCOUNTER — Encounter: Payer: Self-pay | Admitting: Physical Therapy

## 2024-03-07 ENCOUNTER — Inpatient Hospital Stay: Attending: Internal Medicine

## 2024-03-07 VITALS — BP 116/72 | HR 74 | Temp 97.3°F | Resp 17 | Ht 63.0 in | Wt 199.0 lb

## 2024-03-07 DIAGNOSIS — D539 Nutritional anemia, unspecified: Secondary | ICD-10-CM

## 2024-03-07 DIAGNOSIS — R2681 Unsteadiness on feet: Secondary | ICD-10-CM | POA: Insufficient documentation

## 2024-03-07 DIAGNOSIS — R42 Dizziness and giddiness: Secondary | ICD-10-CM | POA: Insufficient documentation

## 2024-03-07 DIAGNOSIS — M5459 Other low back pain: Secondary | ICD-10-CM | POA: Insufficient documentation

## 2024-03-07 DIAGNOSIS — D519 Vitamin B12 deficiency anemia, unspecified: Secondary | ICD-10-CM | POA: Insufficient documentation

## 2024-03-07 DIAGNOSIS — M6281 Muscle weakness (generalized): Secondary | ICD-10-CM | POA: Diagnosis present

## 2024-03-07 DIAGNOSIS — D509 Iron deficiency anemia, unspecified: Secondary | ICD-10-CM | POA: Diagnosis present

## 2024-03-07 LAB — IRON AND IRON BINDING CAPACITY (CC-WL,HP ONLY)
Iron: 42 ug/dL (ref 28–170)
Saturation Ratios: 20 % (ref 10.4–31.8)
TIBC: 210 ug/dL — ABNORMAL LOW (ref 250–450)
UIBC: 168 ug/dL (ref 148–442)

## 2024-03-07 LAB — CBC WITH DIFFERENTIAL (CANCER CENTER ONLY)
Abs Immature Granulocytes: 0 K/uL (ref 0.00–0.07)
Basophils Absolute: 0.1 K/uL (ref 0.0–0.1)
Basophils Relative: 2 %
Eosinophils Absolute: 0.2 K/uL (ref 0.0–0.5)
Eosinophils Relative: 5 %
HCT: 28.9 % — ABNORMAL LOW (ref 36.0–46.0)
Hemoglobin: 9 g/dL — ABNORMAL LOW (ref 12.0–15.0)
Immature Granulocytes: 0 %
Lymphocytes Relative: 35 %
Lymphs Abs: 1.6 K/uL (ref 0.7–4.0)
MCH: 25.9 pg — ABNORMAL LOW (ref 26.0–34.0)
MCHC: 31.1 g/dL (ref 30.0–36.0)
MCV: 83.3 fL (ref 80.0–100.0)
Monocytes Absolute: 0.5 K/uL (ref 0.1–1.0)
Monocytes Relative: 10 %
Neutro Abs: 2.2 K/uL (ref 1.7–7.7)
Neutrophils Relative %: 48 %
Platelet Count: 196 K/uL (ref 150–400)
RBC: 3.47 MIL/uL — ABNORMAL LOW (ref 3.87–5.11)
RDW: 17.3 % — ABNORMAL HIGH (ref 11.5–15.5)
WBC Count: 4.5 K/uL (ref 4.0–10.5)
nRBC: 0 % (ref 0.0–0.2)

## 2024-03-07 LAB — FOLATE: Folate: >20 ng/mL (ref >5.9)

## 2024-03-07 LAB — FERRITIN: Ferritin: 1152 ng/mL — ABNORMAL HIGH (ref 11–307)

## 2024-03-07 LAB — VITAMIN B12: Vitamin B-12: 1798 pg/mL — ABNORMAL HIGH (ref 180–914)

## 2024-03-07 NOTE — Progress Notes (Signed)
 [facility] [phone]   [phone]  OFFICE PROGRESS NOTE  Jerel Gee, NP [address] [place] [address]  DIAGNOSIS: Normocytic anemia with a combination of iron  deficiency as well as vitamin B12 deficiency.  PRIOR THERAPY: She is status post treatment with Venofer  200 Mg IV weekly for 4 doses in addition to vitamin B12 injections.  CURRENT THERAPY: Ferrous sulfate 325 mg p.o. daily.  INTERVAL HISTORY: Meredith Cline 73 y.o. female returns to the clinic today for 67-month follow-up visit.Discussed the use of AI scribe software for clinical note transcription with the patient, who gave verbal consent to proceed.  History of Present Illness Meredith Cline is a 72 year old female with normocytic anemia due to iron  and vitamin B12 deficiency who presents for evaluation and repeat blood work.  She has a history of normocytic anemia with a combination of iron  deficiency and vitamin B12 deficiency. She has previously received iron  infusions and vitamin B12 injections. Currently, she is on oral ferrous sulfate 325 mg daily.  She experiences dizziness, which occurs sometimes when sitting and opening her eyes, as well as when bending over to pick something up and then standing up. No chest pain or breathing issues.  She recently learned to take her iron  supplement with vitamin C or orange juice after her doctor mentioned it during this visit. She is not currently receiving vitamin B12 injections and is unsure if she has received them in the past, although she recalls receiving iron  infusions.    MEDICAL HISTORY: Past Medical History:  Diagnosis Date   Anemia    Arthritis    Diabetes mellitus without complication (HCC)    GERD (gastroesophageal reflux disease)    Hypercholesterolemia    Hypertension    Sleep apnea     ALLERGIES:  is allergic to gabapentin, clindamycin, and penicillins.  MEDICATIONS:  Current Outpatient  Medications  Medication Sig Dispense Refill   amLODipine-valsartan (EXFORGE) 10-320 MG tablet Take 1 tablet by mouth daily.     baclofen (LIORESAL) 10 MG tablet Take 10 mg by mouth 2 (two) times daily.     BIOTIN PO Take by mouth.     carvedilol (COREG) 25 MG tablet Take 25 mg by mouth 2 (two) times daily.     cholecalciferol (VITAMIN D3) 25 MCG (1000 UNIT) tablet Take 1,000 Units by mouth daily.     FARXIGA 10 MG TABS tablet Take 10 mg by mouth daily.     ferrous sulfate 325 (65 FE) MG tablet Take 325 mg by mouth daily with breakfast.     fluticasone (FLONASE) 50 MCG/ACT nasal spray Place 2 sprays into both nostrils daily as needed for allergies or rhinitis.     HYDROcodone -acetaminophen  (NORCO) 10-325 MG tablet Take 1 tablet by mouth 2 (two) times daily as needed.     leflunomide (ARAVA) 20 MG tablet Take 20 mg by mouth daily.     levocetirizine (XYZAL) 5 MG tablet Take 5 mg by mouth daily.     metFORMIN (GLUCOPHAGE) 500 MG tablet Take 500 mg by mouth 2 (two) times daily with a meal.     Multiple Vitamin (MULTI-VITAMIN) tablet Take 1 tablet by mouth in the morning and at bedtime.     Omega-3 Fatty Acids (FISH OIL) 1000 MG CAPS Take 1,000 mg by mouth in the morning and at bedtime.     omeprazole (PRILOSEC) 20 MG capsule Take 20 mg by mouth daily.  OXYCODONE ER PO Take by mouth.     pregabalin  (LYRICA ) 200 MG capsule Take 1 capsule (200 mg total) by mouth 2 (two) times daily. 60 capsule 3   simvastatin (ZOCOR) 40 MG tablet Take 40 mg by mouth daily.     torsemide (DEMADEX) 10 MG tablet Take 10 mg by mouth daily.     No current facility-administered medications for this visit.    SURGICAL HISTORY:  Past Surgical History:  Procedure Laterality Date   ABDOMINAL HYSTERECTOMY     BREAST REDUCTION SURGERY     CARPAL TUNNEL RELEASE     HERNIA REPAIR     NASAL SEPTUM SURGERY     REDUCTION MAMMAPLASTY     app. 1980    REVIEW OF SYSTEMS:  A comprehensive review of systems was negative  except for: Constitutional: positive for fatigue Neurological: positive for dizziness   PHYSICAL EXAMINATION: General appearance: alert, cooperative, fatigued, and no distress Head: Normocephalic, without obvious abnormality, atraumatic Neck: no adenopathy, no JVD, supple, symmetrical, trachea midline, and thyroid  not enlarged, symmetric, no tenderness/mass/nodules Lymph nodes: Cervical, supraclavicular, and axillary nodes normal. Resp: clear to auscultation bilaterally Back: symmetric, no curvature. ROM normal. No CVA tenderness. Cardio: regular rate and rhythm, S1, S2 normal, no murmur, click, rub or gallop GI: soft, non-tender; bowel sounds normal; no masses,  no organomegaly Extremities: extremities normal, atraumatic, no cyanosis or edema  ECOG PERFORMANCE STATUS: 1 - Symptomatic but completely ambulatory  Blood pressure 116/72, pulse 74, temperature (!) 97.3 F (36.3 C), resp. rate 17, height 5' 3 (1.6 m), weight 199 lb (90.3 kg), SpO2 100%, peak flow (!) 3 L/min.  LABORATORY DATA: Lab Results  Component Value Date   WBC 4.5 03/07/2024   HGB 9.0 (L) 03/07/2024   HCT 28.9 (L) 03/07/2024   MCV 83.3 03/07/2024   PLT 196 03/07/2024      Chemistry      Component Value Date/Time   NA 143 05/18/2023 1501   K 4.4 05/18/2023 1501   CL 107 05/18/2023 1501   CO2 26 05/18/2023 1501   BUN 30 (H) 05/18/2023 1501   CREATININE 1.78 (H) 05/18/2023 1501      Component Value Date/Time   CALCIUM 9.3 05/18/2023 1501   ALKPHOS 25 (L) 05/18/2023 1501   AST 21 05/18/2023 1501   ALT 11 05/18/2023 1501   BILITOT 0.4 05/18/2023 1501       RADIOGRAPHIC STUDIES: No results found.  ASSESSMENT AND PLAN: This is a very pleasant 73 years old African-American female with normocytic anemia likely secondary to mixture of iron  deficiency as well as vitamin B12 deficiency.  The patient was treated with iron  infusion with Venofer  in addition to weekly vitamin B12 injection. The patient had a bone  marrow biopsy and aspirate performed recently that showed no concerning findings except for possibility of early MDS.  She was treated with iron  infusion in the past with no improvement in her anemia. She is currently on oral ferrous sulfate once daily. Assessment and Plan Assessment & Plan Normocytic anemia due to iron  and vitamin B12 deficiency Normocytic anemia with a combination of iron  deficiency and vitamin B12 deficiency. Hemoglobin level is currently 9.0, decreased from previous levels in the 10 range. Awaiting results of iron  and B12 levels to determine the need for iron  infusion or B12 injection. - Continue oral ferrous sulfate 325 mg daily with vitamin C or orange juice. - Re-evaluate in 6 months with repeat blood work.  Dizziness Intermittent dizziness, not related  to positional changes, possibly related to anemia. She was advised to call immediately if she has any concerning symptoms in the interval.  The patient voices understanding of current disease status and treatment options and is in agreement with the current care plan. All questions were answered. The patient knows to call the clinic with any problems, questions or concerns. We can certainly see the patient much sooner if necessary.  The total time spent in the appointment was 20 minutes.  Disclaimer: This note was dictated with voice recognition software. Similar sounding words can inadvertently be transcribed and may not be corrected upon review.

## 2024-04-18 ENCOUNTER — Encounter: Payer: Self-pay | Admitting: Podiatry

## 2024-04-18 ENCOUNTER — Ambulatory Visit: Admitting: Podiatry

## 2024-04-18 DIAGNOSIS — E1142 Type 2 diabetes mellitus with diabetic polyneuropathy: Secondary | ICD-10-CM

## 2024-04-22 NOTE — Progress Notes (Signed)
 She presents today for follow-up of her neuropathy.  She states that there is no change with the neuro Lyrica  or without it.  Objective: No change in neuropathy.  No change in physical exam pulses remain palpable with no open lesions or wounds.  Assessment: Neuropathy.  Plan: I recommended that she taper off the Lyrica  and I described that to her.  Particularly if this is not working for her there is no need to be taking it.

## 2024-09-05 ENCOUNTER — Inpatient Hospital Stay: Admitting: Internal Medicine

## 2024-09-05 ENCOUNTER — Inpatient Hospital Stay
